# Patient Record
Sex: Male | Born: 1955 | Race: White | Hispanic: No | Marital: Single | Smoking: Former smoker
Health system: Southern US, Community
[De-identification: ages and names within clinical notes are randomized; demographics above are authoritative.]

## PROBLEM LIST (undated history)

## (undated) DIAGNOSIS — Z7289 Other problems related to lifestyle: Secondary | ICD-10-CM

## (undated) DIAGNOSIS — J189 Pneumonia, unspecified organism: Secondary | ICD-10-CM

## (undated) DIAGNOSIS — N3020 Other chronic cystitis without hematuria: Secondary | ICD-10-CM

## (undated) DIAGNOSIS — J449 Chronic obstructive pulmonary disease, unspecified: Secondary | ICD-10-CM

## (undated) DIAGNOSIS — R42 Dizziness and giddiness: Secondary | ICD-10-CM

## (undated) DIAGNOSIS — C3490 Malignant neoplasm of unspecified part of unspecified bronchus or lung: Secondary | ICD-10-CM

## (undated) DIAGNOSIS — Z789 Other specified health status: Secondary | ICD-10-CM

## (undated) DIAGNOSIS — B1920 Unspecified viral hepatitis C without hepatic coma: Secondary | ICD-10-CM

## (undated) DIAGNOSIS — N529 Male erectile dysfunction, unspecified: Secondary | ICD-10-CM

## (undated) DIAGNOSIS — F1090 Alcohol use, unspecified, uncomplicated: Secondary | ICD-10-CM

## (undated) DIAGNOSIS — K439 Ventral hernia without obstruction or gangrene: Secondary | ICD-10-CM

## (undated) DIAGNOSIS — Z87828 Personal history of other (healed) physical injury and trauma: Secondary | ICD-10-CM

## (undated) HISTORY — PX: OTHER SURGICAL HISTORY: SHX169

---

## 2012-11-30 DIAGNOSIS — B1920 Unspecified viral hepatitis C without hepatic coma: Secondary | ICD-10-CM | POA: Insufficient documentation

## 2012-11-30 DIAGNOSIS — C3490 Malignant neoplasm of unspecified part of unspecified bronchus or lung: Secondary | ICD-10-CM | POA: Insufficient documentation

## 2018-01-04 ENCOUNTER — Encounter: Payer: Self-pay | Admitting: Radiation Oncology

## 2018-01-04 NOTE — Progress Notes (Signed)
Thoracic Location of Tumor / Histology: History of lung cancer x3 with enlarging RLL lesion  CT Thorax w/o contrast 10/05/17:  Slowly enlarging ground glass nodule in the anterior aspect of the right lower lobe is suspicious for a lesion in the adenocarcinoma in situ spectrum.   Diagnosis/Treatment:  08/18/2009 Stage IB (pT2aN0M0) adenocarcinoma LUL  11/16/2009 Stage IA (pT1aN0M0) adenocarcinoma RUL  November 2014 enlarging RLL nodule noted on surveillance CT scan presumed cancerous  Treatment:  S/p LUL lobectomy 2011  S/p RUL wedge resection 2011  SBRT 50 Gy in 10 fractions to RLL 01/01/13-01/12/13   Tobacco/Marijuana/Snuff/ETOH use: current cigarette smoker; 1/2 ppd  Reports not having had anything alcoholic to drink in a week.   Signs/Symptoms  Weight changes, if any: Denies  Respiratory complaints, if any: Denies  Hemoptysis, if any: Denies  Pain issues, if any:  Denies  SAFETY ISSUES:  Prior radiation? SBRT 50 Gy in 10 fractions to RLL 01/01/13-01/12/13  Pacemaker/ICD? No  Possible current pregnancy? N/A; pt is male  Is the patient on methotrexate? No  Current Complaints / other details:  Xavier Jackson presents today with his sister for his radiation consult. Denies any hemoptysis or purulent sputum. Reports shortness of breath with mild to moderate activity. Denies any nausea, vomiting, or diarrhea. Reports mild fatigue but states that it is manageable. Received radiation in 2014 at [facility] from Dr. Brunetta Jeans.

## 2018-01-06 ENCOUNTER — Other Ambulatory Visit: Payer: Self-pay | Admitting: Radiation Oncology

## 2018-01-06 ENCOUNTER — Ambulatory Visit
Admission: RE | Admit: 2018-01-06 | Discharge: 2018-01-06 | Disposition: A | Discharge status: Home / Self Care | Payer: Self-pay | Source: Ambulatory Visit | Attending: Radiation Oncology | Admitting: Radiation Oncology

## 2018-01-06 DIAGNOSIS — C3490 Malignant neoplasm of unspecified part of unspecified bronchus or lung: Secondary | ICD-10-CM

## 2018-01-09 ENCOUNTER — Ambulatory Visit
Admission: RE | Admit: 2018-01-09 | Discharge: 2018-01-09 | Disposition: A | Discharge status: Home / Self Care | Payer: Medicare Other | Source: Ambulatory Visit | Attending: Radiation Oncology | Admitting: Radiation Oncology

## 2018-01-09 ENCOUNTER — Other Ambulatory Visit: Payer: Self-pay

## 2018-01-09 ENCOUNTER — Encounter: Payer: Self-pay | Admitting: Radiation Oncology

## 2018-01-09 VITALS — BP 143/86 | HR 76 | Temp 99.1°F | Resp 18 | Ht 73.0 in | Wt 204.1 lb

## 2018-01-09 DIAGNOSIS — C3431 Malignant neoplasm of lower lobe, right bronchus or lung: Secondary | ICD-10-CM | POA: Insufficient documentation

## 2018-01-09 HISTORY — DX: Unspecified viral hepatitis C without hepatic coma: B19.20

## 2018-01-09 HISTORY — DX: Other chronic cystitis without hematuria: N30.20

## 2018-01-09 HISTORY — DX: Male erectile dysfunction, unspecified: N52.9

## 2018-01-09 HISTORY — DX: Chronic obstructive pulmonary disease, unspecified: J44.9

## 2018-01-09 HISTORY — DX: Pneumonia, unspecified organism: J18.9

## 2018-01-09 HISTORY — DX: Alcohol use, unspecified, uncomplicated: F10.90

## 2018-01-09 HISTORY — DX: Dizziness and giddiness: R42

## 2018-01-09 HISTORY — DX: Other problems related to lifestyle: Z72.89

## 2018-01-09 HISTORY — DX: Other specified health status: Z78.9

## 2018-01-09 HISTORY — DX: Personal history of other (healed) physical injury and trauma: Z87.828

## 2018-01-09 HISTORY — DX: Ventral hernia without obstruction or gangrene: K43.9
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
## 2018-01-30 ENCOUNTER — Ambulatory Visit: Payer: No Typology Code available for payment source | Admitting: Radiation Oncology

## 2018-02-06 ENCOUNTER — Ambulatory Visit
Admission: RE | Admit: 2018-02-06 | Discharge: 2018-02-06 | Disposition: A | Discharge status: Home / Self Care | Payer: No Typology Code available for payment source | Source: Ambulatory Visit | Attending: Radiation Oncology | Admitting: Radiation Oncology

## 2018-02-06 DIAGNOSIS — Z510 Encounter for antineoplastic radiation therapy: Secondary | ICD-10-CM | POA: Diagnosis present

## 2018-02-06 DIAGNOSIS — C3431 Malignant neoplasm of lower lobe, right bronchus or lung: Secondary | ICD-10-CM | POA: Diagnosis not present

## 2018-02-06 NOTE — Progress Notes (Signed)
  Radiation Oncology         [phone]) [phone] ________________________________  Name: Xavier Jackson MRN: [id number]  Date: 02/06/2018  DOB: 1955/05/14   STEREOTACTIC BODY RADIOTHERAPY SIMULATION AND TREATMENT PLANNING NOTE    DIAGNOSIS:  The encounter diagnosis was Malignant neoplasm of lower lobe of right lung, clinical stage IA ([place]).   NARRATIVE:  The patient was brought to the [facility].  Identity was confirmed.  All relevant records and images related to the planned course of therapy were reviewed.  The patient freely provided informed written consent to proceed with treatment after reviewing the details related to the planned course of therapy. The consent form was witnessed and verified by the simulation staff.  Then, the patient was set-up in a stable reproducible  supine position for radiation therapy.  A BodyFix immobilization pillow was fabricated for reproducible positioning.  Then I personally applied the abdominal compression paddle to limit respiratory excursion.  4D respiratoy motion management CT images were obtained.  Surface markings were placed.  The CT images were loaded into the planning software.  Then, using Cine, MIP, and standard views, the internal target volume (ITV) and planning target volumes (PTV) were delinieated, and avoidance structures were contoured.  Treatment planning then occurred.  The radiation prescription was entered and confirmed.  A total of two complex treatment devices were fabricated in the form of the BodyFix immobilization pillow and a neck accuform cushion.  I have requested : 3D Simulation  I have requested a DVH of the following structures: Heart, Lungs, Esophagus, Chest Wall, Brachial Plexus, Major Blood Vessels, and targets.  PLAN:  The patient will receive 54 Gy in 3 fractions.   On the patient's PET scan 11/04/2017 and this lesion measured 0.7 cm., Size was below the PET resolution level. The patient's CT scan [facility] on December 3 this lesion had increased in size to 1.1 cm.  On the patient's  planning CT scan today this lesion measures 1.3 cm in greatest dimension. I discussed options for management with the patient including the continued close follow-up or to proceed with stereotactic body radiation therapy of this lesion. Patient feels most comfortable with proceeding with treatments at this time especially since this lesion has increased significantly in size since October of last year.  -----------------------------------  Blair Promise, PhD, MD This document serves as a record of services personally performed by Gery Pray, MD. It was created on his behalf by Mary-Margaret Loma Messing, a trained medical scribe. The creation of this record is based on the scribe's personal observations and the provider's statements to them. This document has been checked and approved by the attending provider.

## 2018-02-07 ENCOUNTER — Ambulatory Visit: Payer: No Typology Code available for payment source | Admitting: Radiation Oncology

## 2018-02-08 DIAGNOSIS — Z510 Encounter for antineoplastic radiation therapy: Secondary | ICD-10-CM | POA: Diagnosis not present

## 2018-02-09 ENCOUNTER — Ambulatory Visit: Payer: No Typology Code available for payment source | Admitting: Radiation Oncology

## 2018-02-14 ENCOUNTER — Ambulatory Visit: Payer: No Typology Code available for payment source | Admitting: Radiation Oncology

## 2018-02-15 ENCOUNTER — Ambulatory Visit
Admission: RE | Admit: 2018-02-15 | Discharge: 2018-02-15 | Disposition: A | Discharge status: Home / Self Care | Payer: No Typology Code available for payment source | Source: Ambulatory Visit | Attending: Radiation Oncology | Admitting: Radiation Oncology

## 2018-02-15 DIAGNOSIS — Z510 Encounter for antineoplastic radiation therapy: Secondary | ICD-10-CM | POA: Diagnosis not present

## 2018-02-15 DIAGNOSIS — C3431 Malignant neoplasm of lower lobe, right bronchus or lung: Secondary | ICD-10-CM

## 2018-02-15 NOTE — Progress Notes (Signed)
  Radiation Oncology         [phone]) [phone] ________________________________  Name: Allan Minotti MRN: [id number]  Date: 02/15/2018  DOB: 28-Oct-1955  Stereotactic Body Radiotherapy Treatment Procedure Note  NARRATIVE:  Jamarii Banks was brought to the stereotactic radiation treatment machine and placed supine on the CT couch. The patient was set up for stereotactic body radiotherapy on the body fix pillow.  3D TREATMENT PLANNING AND DOSIMETRY:  The patient's radiation plan was reviewed and approved prior to starting treatment.  It showed 3-dimensional radiation distributions overlaid onto the planning CT.  The [facility] for the target structures as well as the organs at risk were reviewed. The documentation of this is filed in the radiation oncology EMR.  SIMULATION VERIFICATION:  The patient underwent CT imaging on the treatment unit.  These were carefully aligned to document that the ablative radiation dose would cover the target volume and maximally spare the nearby organs at risk according to the planned distribution.  SPECIAL TREATMENT PROCEDURE: Jakarri Lesko received high dose ablative stereotactic body radiotherapy to the planned target volume without unforeseen complications. Treatment was delivered uneventfully. The high doses associated with stereotactic body radiotherapy and the significant potential risks require careful treatment set up and patient monitoring constituting a special treatment procedure   STEREOTACTIC TREATMENT MANAGEMENT:  Following delivery, the patient was evaluated clinically. The patient tolerated treatment without significant acute effects, and was discharged to home in stable condition.    PLAN: Continue treatment as planned.  ________________________________  Blair Promise, PhD, MD   This document serves as a record of services personally performed by Gery Pray, MD. It was created on his behalf by Mary-Margaret Loma Messing, a trained medical  scribe. The creation of this record is based on the scribe's personal observations and the provider's statements to them. This document has been checked and approved by the attending provider.

## 2018-02-16 ENCOUNTER — Ambulatory Visit: Payer: No Typology Code available for payment source | Admitting: Radiation Oncology

## 2018-02-17 ENCOUNTER — Ambulatory Visit
Admission: RE | Admit: 2018-02-17 | Discharge: 2018-02-17 | Disposition: A | Discharge status: Home / Self Care | Payer: No Typology Code available for payment source | Source: Ambulatory Visit | Attending: Radiation Oncology | Admitting: Radiation Oncology

## 2018-02-17 DIAGNOSIS — Z510 Encounter for antineoplastic radiation therapy: Secondary | ICD-10-CM | POA: Diagnosis not present

## 2018-02-21 ENCOUNTER — Ambulatory Visit
Admission: RE | Admit: 2018-02-21 | Discharge: 2018-02-21 | Disposition: A | Discharge status: Home / Self Care | Payer: No Typology Code available for payment source | Source: Ambulatory Visit | Attending: Radiation Oncology | Admitting: Radiation Oncology

## 2018-02-21 ENCOUNTER — Encounter: Payer: Self-pay | Admitting: Radiation Oncology

## 2018-02-21 DIAGNOSIS — Z510 Encounter for antineoplastic radiation therapy: Secondary | ICD-10-CM | POA: Diagnosis not present

## 2018-02-21 DIAGNOSIS — C3431 Malignant neoplasm of lower lobe, right bronchus or lung: Secondary | ICD-10-CM

## 2018-02-21 NOTE — Progress Notes (Signed)
  Radiation Oncology         (336) [phone] ________________________________  Name: Xavier Jackson MRN: [id number]  Date: 02/21/2018  DOB: 28-Jun-1955  Stereotactic Body Radiotherapy Treatment Procedure Note  NARRATIVE:  Xavier Jackson was brought to the stereotactic radiation treatment machine and placed supine on the CT couch. The patient was set up for stereotactic body radiotherapy on the body fix pillow.  3D TREATMENT PLANNING AND DOSIMETRY:  The patient's radiation plan was reviewed and approved prior to starting treatment.  It showed 3-dimensional radiation distributions overlaid onto the planning CT.  The [facility] for the target structures as well as the organs at risk were reviewed. The documentation of this is filed in the radiation oncology EMR.  SIMULATION VERIFICATION:  The patient underwent CT imaging on the treatment unit.  These were carefully aligned to document that the ablative radiation dose would cover the target volume and maximally spare the nearby organs at risk according to the planned distribution.  SPECIAL TREATMENT PROCEDURE: Xavier Jackson received high dose ablative stereotactic body radiotherapy to the planned target volume without unforeseen complications. Treatment was delivered uneventfully. The high doses associated with stereotactic body radiotherapy and the significant potential risks require careful treatment set up and patient monitoring constituting a special treatment procedure   STEREOTACTIC TREATMENT MANAGEMENT:  Following delivery, the patient was evaluated clinically. The patient tolerated treatment without significant acute effects, and was discharged to home in stable condition.    PLAN: Continue treatment as planned.  ________________________________  Blair Promise, PhD, MD

## 2018-02-23 NOTE — Progress Notes (Signed)
  Radiation Oncology         [phone]) [phone] ________________________________  Name: Xavier Jackson MRN: [id number]  Date: 02/21/2018  DOB: 1955/04/04  End of Treatment Note  Diagnosis:   63 y.o. male with Malignant neoplasm of lower lobe of right lung, clinical stage IA ([place])     Indication for treatment:  Curative       Radiation treatment dates:   02/15/2018, 02/17/2018, 02/21/2018  Site/dose:   The tumor in the right lower lobe was treated with a course of stereotactic body radiation treatment. The patient received 54 Gy in 3 fractions at 18 Gy per fraction.  Beams/energy:   SBRT/SRT-VMAT // 6X-FFF Photon   Narrative: The patient tolerated radiation treatment relatively well.   The patient did not have any signs of acute toxicity during treatment.    Plan: The patient has completed radiation treatment. The patient will return to radiation oncology clinic for routine followup in one month. I recommended the patient consider using his albuterol treatment in light of the wheezing in his left lung, and he will meet with his [place] oncologist in late February. I advised the patient to call or return sooner if they have any questions or concerns related to their recovery or treatment.   ------------------------------------------------  Blair Promise, PhD, MD  This document serves as a record of services personally performed by Gery Pray, MD. It was created on his behalf by Rae Lips, a trained medical scribe. The creation of this record is based on the scribe's personal observations and the provider's statements to them. This document has been checked and approved by the attending provider.

## 2018-03-23 ENCOUNTER — Telehealth: Payer: Self-pay | Admitting: *Deleted

## 2018-03-23 NOTE — Telephone Encounter (Signed)
[facility], [facility] consult note , end of tx note, sim and planning note

## 2018-03-30 ENCOUNTER — Ambulatory Visit
Admission: RE | Admit: 2018-03-30 | Discharge: 2018-03-30 | Disposition: A | Discharge status: Home / Self Care | Payer: Medicare Other | Source: Ambulatory Visit | Attending: Radiation Oncology | Admitting: Radiation Oncology

## 2018-03-30 ENCOUNTER — Encounter: Payer: Self-pay | Admitting: Radiation Oncology

## 2018-03-30 ENCOUNTER — Other Ambulatory Visit: Payer: Self-pay

## 2018-03-30 ENCOUNTER — Encounter (INDEPENDENT_AMBULATORY_CARE_PROVIDER_SITE_OTHER): Payer: Self-pay

## 2018-03-30 VITALS — BP 111/76 | HR 82 | Temp 99.1°F | Resp 18 | Wt 210.2 lb

## 2018-03-30 DIAGNOSIS — R0602 Shortness of breath: Secondary | ICD-10-CM | POA: Diagnosis not present

## 2018-03-30 DIAGNOSIS — R05 Cough: Secondary | ICD-10-CM | POA: Diagnosis not present

## 2018-03-30 DIAGNOSIS — Z923 Personal history of irradiation: Secondary | ICD-10-CM | POA: Insufficient documentation

## 2018-03-30 DIAGNOSIS — C3431 Malignant neoplasm of lower lobe, right bronchus or lung: Secondary | ICD-10-CM | POA: Diagnosis present

## 2018-03-30 DIAGNOSIS — Z79899 Other long term (current) drug therapy: Secondary | ICD-10-CM | POA: Diagnosis not present

## 2018-03-30 NOTE — Progress Notes (Signed)
  Radiation Oncology         [phone]) [phone] ________________________________  Name: Xavier Jackson MRN: [id number]  Date: 03/30/2018  DOB: 13-May-1955  Follow-Up Visit Note  CC: System, Pcp Not In  Alcide Evener, MD    ICD-10-CM   1. Malignant neoplasm of lower lobe of right lung [facility]) C34.31     Diagnosis:   Clinical Stage IA Right Lower Lobe Lung Cancer  Interval Since Last Radiation:  5 weeks and 2 days  1/22, 1/24, 02/21/2018: Right Lung, Lower Lobe / 54 Gy in 3 fractions (SBRT)  Narrative:  The patient returns today for routine follow-up after completion of radiotherapy on 02/21/2018. He reports [facility] and will see Dr. Vashti Hey following that scan.  He reports doing well overall. He denies any pain, radiation-related fatigue, hemoptysis, and difficulty swallowing. He reports cough with white sputum, heart burn with some relief by OTC antacids, and occasional shortness of breath with exertion.  ALLERGIES:  has No Known Allergies.  Meds: Current Outpatient Medications  Medication Sig Dispense Refill  . albuterol (PROVENTIL HFA;VENTOLIN HFA) 108 (90 Base) MCG/ACT inhaler Inhale 2 puffs into the lungs 4 (four) times daily.    Marland Kitchen alprostadil (EDEX) 40 MCG injection 40 mcg by Intracavitary route as needed for erectile dysfunction. use no more than 3 times per week    . Meclizine HCl 25 MG CHEW Chew 1 tablet by mouth 3 (three) times daily.     No current facility-administered medications for this encounter.     Physical Findings: The patient is in no acute distress. Patient is alert and oriented.  weight is 210 lb 3.2 oz (95.3 kg). His oral temperature is 99.1 F (37.3 C). His blood pressure is 111/76 and his pulse is 82. His respiration is 18 and oxygen saturation is 96%. .  No significant changes.  Lab Findings: No results found for: WBC, HGB, HCT, MCV, PLT  Radiographic Findings: No results found.  Impression:   Clinical Stage IA Right Lower Lobe Lung Cancer No reported side effects from stereotactic body radiation therapy  Plan:  Patient reports he will [facility]. He will follow up in radiation oncology in 3 months.  -----------------------------------  Blair Promise, PhD, MD  This document serves as a record of services personally performed by Gery Pray, MD. It was created on his behalf by Wilburn Mylar, a trained medical scribe. The creation of this record is based on the scribe's personal observations and the provider's statements to them. This document has been checked and approved by the attending provider.

## 2018-03-30 NOTE — Progress Notes (Signed)
Pt presents today for f/u with Dr. Sondra Come. Pt is unaccompanied. Pt denies any radiation-related fatigue. Pt denies c/o pain. Pt reports cough with white sputum. Pt denies hemoptysis. Pt reports "heart burn" sensation and is eased some by OTC antiacids. Pt denies difficulty swallowing. Pt reports SOB with exertion occasionally.   BP 111/76 (BP Location: Left Arm, Patient Position: Sitting)   Pulse 82   Temp 99.1 F (37.3 C) (Oral)   Resp 18   Wt 210 lb 3.2 oz (95.3 kg)   SpO2 96%   BMI 27.73 kg/m   Wt Readings from Last 3 Encounters:  03/30/18 210 lb 3.2 oz (95.3 kg)  01/09/18 204 lb 1 oz (92.6 kg)   Loma Sousa, RN BSN

## 2018-07-06 ENCOUNTER — Ambulatory Visit
Admission: RE | Admit: 2018-07-06 | Discharge: 2018-07-06 | Disposition: A | Discharge status: Home / Self Care | Payer: No Typology Code available for payment source | Source: Ambulatory Visit | Attending: Radiation Oncology | Admitting: Radiation Oncology

## 2019-01-26 DIAGNOSIS — S72002A Fracture of unspecified part of neck of left femur, initial encounter for closed fracture: Secondary | ICD-10-CM | POA: Insufficient documentation

## 2019-01-26 HISTORY — PX: HIP ARTHROPLASTY: SHX981

## 2019-01-29 DIAGNOSIS — D509 Iron deficiency anemia, unspecified: Secondary | ICD-10-CM | POA: Insufficient documentation

## 2019-01-30 DIAGNOSIS — J449 Chronic obstructive pulmonary disease, unspecified: Secondary | ICD-10-CM | POA: Insufficient documentation

## 2019-01-30 DIAGNOSIS — G8918 Other acute postprocedural pain: Secondary | ICD-10-CM | POA: Insufficient documentation

## 2019-01-30 DIAGNOSIS — Z299 Encounter for prophylactic measures, unspecified: Secondary | ICD-10-CM | POA: Insufficient documentation

## 2019-02-07 DIAGNOSIS — Z789 Other specified health status: Secondary | ICD-10-CM | POA: Insufficient documentation

## 2019-02-07 DIAGNOSIS — Z7409 Other reduced mobility: Secondary | ICD-10-CM | POA: Insufficient documentation

## 2019-02-09 DIAGNOSIS — M25519 Pain in unspecified shoulder: Secondary | ICD-10-CM | POA: Insufficient documentation

## 2019-02-09 DIAGNOSIS — K5730 Diverticulosis of large intestine without perforation or abscess without bleeding: Secondary | ICD-10-CM | POA: Insufficient documentation

## 2019-02-09 DIAGNOSIS — L989 Disorder of the skin and subcutaneous tissue, unspecified: Secondary | ICD-10-CM | POA: Insufficient documentation

## 2019-02-09 DIAGNOSIS — C61 Malignant neoplasm of prostate: Secondary | ICD-10-CM | POA: Insufficient documentation

## 2019-02-09 DIAGNOSIS — R7611 Nonspecific reaction to tuberculin skin test without active tuberculosis: Secondary | ICD-10-CM | POA: Insufficient documentation

## 2019-02-09 DIAGNOSIS — M129 Arthropathy, unspecified: Secondary | ICD-10-CM | POA: Insufficient documentation

## 2019-02-09 DIAGNOSIS — Z800 Family history of malignant neoplasm of digestive organs: Secondary | ICD-10-CM | POA: Insufficient documentation

## 2019-02-09 DIAGNOSIS — D126 Benign neoplasm of colon, unspecified: Secondary | ICD-10-CM | POA: Insufficient documentation

## 2019-02-09 DIAGNOSIS — R945 Abnormal results of liver function studies: Secondary | ICD-10-CM | POA: Insufficient documentation

## 2019-02-09 DIAGNOSIS — L570 Actinic keratosis: Secondary | ICD-10-CM | POA: Insufficient documentation

## 2019-02-09 DIAGNOSIS — F1020 Alcohol dependence, uncomplicated: Secondary | ICD-10-CM | POA: Insufficient documentation

## 2019-02-09 DIAGNOSIS — N320 Bladder-neck obstruction: Secondary | ICD-10-CM | POA: Insufficient documentation

## 2019-02-09 DIAGNOSIS — L719 Rosacea, unspecified: Secondary | ICD-10-CM | POA: Insufficient documentation

## 2019-02-09 DIAGNOSIS — R042 Hemoptysis: Secondary | ICD-10-CM | POA: Insufficient documentation

## 2019-02-09 DIAGNOSIS — F1010 Alcohol abuse, uncomplicated: Secondary | ICD-10-CM | POA: Insufficient documentation

## 2019-02-09 DIAGNOSIS — M2040 Other hammer toe(s) (acquired), unspecified foot: Secondary | ICD-10-CM | POA: Insufficient documentation

## 2019-02-09 DIAGNOSIS — J984 Other disorders of lung: Secondary | ICD-10-CM | POA: Insufficient documentation

## 2019-02-09 DIAGNOSIS — N62 Hypertrophy of breast: Secondary | ICD-10-CM | POA: Insufficient documentation

## 2019-02-09 DIAGNOSIS — J40 Bronchitis, not specified as acute or chronic: Secondary | ICD-10-CM | POA: Insufficient documentation

## 2019-02-09 DIAGNOSIS — F5221 Male erectile disorder: Secondary | ICD-10-CM | POA: Insufficient documentation

## 2019-02-09 DIAGNOSIS — Z85118 Personal history of other malignant neoplasm of bronchus and lung: Secondary | ICD-10-CM | POA: Insufficient documentation

## 2019-02-09 DIAGNOSIS — Z87891 Personal history of nicotine dependence: Secondary | ICD-10-CM | POA: Insufficient documentation

## 2019-02-09 DIAGNOSIS — Z8042 Family history of malignant neoplasm of prostate: Secondary | ICD-10-CM | POA: Insufficient documentation

## 2019-02-09 DIAGNOSIS — R972 Elevated prostate specific antigen [PSA]: Secondary | ICD-10-CM | POA: Insufficient documentation

## 2019-02-09 DIAGNOSIS — R109 Unspecified abdominal pain: Secondary | ICD-10-CM | POA: Insufficient documentation

## 2019-05-31 ENCOUNTER — Other Ambulatory Visit: Payer: Self-pay | Admitting: Radiation Oncology

## 2019-05-31 ENCOUNTER — Ambulatory Visit
Admission: RE | Admit: 2019-05-31 | Discharge: 2019-05-31 | Disposition: A | Discharge status: Home / Self Care | Payer: Self-pay | Source: Ambulatory Visit | Attending: Radiation Oncology | Admitting: Radiation Oncology

## 2019-05-31 DIAGNOSIS — C3490 Malignant neoplasm of unspecified part of unspecified bronchus or lung: Secondary | ICD-10-CM

## 2019-06-06 ENCOUNTER — Other Ambulatory Visit: Payer: Self-pay

## 2019-06-06 ENCOUNTER — Encounter: Payer: Self-pay | Admitting: Radiation Oncology

## 2019-06-06 ENCOUNTER — Ambulatory Visit
Admission: RE | Admit: 2019-06-06 | Discharge: 2019-06-06 | Disposition: A | Discharge status: Home / Self Care | Payer: No Typology Code available for payment source | Source: Ambulatory Visit | Attending: Radiation Oncology | Admitting: Radiation Oncology

## 2019-06-06 VITALS — BP 147/86 | HR 90 | Temp 98.3°F | Resp 18 | Ht 72.0 in | Wt 216.1 lb

## 2019-06-06 DIAGNOSIS — C3431 Malignant neoplasm of lower lobe, right bronchus or lung: Secondary | ICD-10-CM | POA: Diagnosis not present

## 2019-06-06 DIAGNOSIS — Z923 Personal history of irradiation: Secondary | ICD-10-CM | POA: Diagnosis not present

## 2019-06-06 DIAGNOSIS — C3490 Malignant neoplasm of unspecified part of unspecified bronchus or lung: Secondary | ICD-10-CM

## 2019-06-06 NOTE — Progress Notes (Signed)
Thoracic Location of Tumor / Histology: Non small cell lung cancer- RLL recurrence  MRI 05/24/2019:   PET 05/07/2019: Enlarging right lower lobe nodule with a maximum SUV of 3.4.  New hypermetabolic lytic lesion in the right acromion max SUV 12.4.  Increased activity along the right lower lobe bandlike density max SUV 2.8 which is greater than previous.  Moderate hypermetabolic activity of calcifications along the anterior omentum.  Questionable small focus of accentuated activity along the left anterior inferior iliac spine SUV 3.7.   CT Chest 04/10/2019: Interval enlargement of a nodule near the site of right upper lobe wedge resection adjacent to the mediastinum, currently 9 x 6 mm, previously 7 x 6 mm.  Additionally, a right lower lobe nodule that previously measured 5 mm in December currently has enlarged, now 9 mm with irregular margins, highly suspicious for malignancy.  CT Chest 01/01/2019: Postsurgical changes of prior left upper lobectomy and right upper lobe wedge resection.  Previously described nodular opacity related to the right upper lobe wedge resection measures 7 x 6 mm.  It has slowly grown compared with prior examinations.  Tumor is not excluded.  5 mm nodule within the superior right lower lobe.  This has slowly grown over multiple prior examinations.  Malignancy is not excluded.  Patient presented  Biopsies of   Tobacco/Marijuana/Snuff/ETOH use: Current Smoker 1/2 PPD  Past/Anticipated interventions by cardiothoracic surgery, if any:   Past/Anticipated interventions by medical oncology, if any:  [facility] 05/10/2019 - We discussed with Xavier Jackson his PET scan and explained that this is all concerning from metastatic cancer and would recommend he undergo a biopsy of the nodule near his mediastinum for a definitive diagnosis which he is agreeable to. -He will also undergo an MRI of his brain to complete his work up. -He will return to clinic in 3 weeks to review results.  -  Given the nodule size without a definitive airway leading to it, navigational bronchoscopy will probably not be high yield.  He would also need a new scan as the most recent one is over a month old.  Additionally, due to the nodules rapid growth I would recommend referring for empiric SBRT again. -There are no enlarged or otherwise abnormal lymph nodes from the available imaging and so EBUS is probably not going to be helpful.  History -08/18/2009- Stage IB adenocarcinoma LUL~ LUL lobectomy -10/27/2009- Stage IA adenocarcinoma RUL~ Wedge resection -11/2012- Enlarging RLL nodule noted on surveillance CT scan, presumed cancerous. -Clinical diagnosis Stage I NSCLC with enlarging RLL nodule with mild hypermetabolic activity on PET 99/3570.  -SBRT 50 Gy x 10 fractions to RLL 12/8-12/19/2014. -1/22-1/28/2020 SBRT RLL 54 Gy in 3 fractions   Signs/Symptoms  Weight changes, if any: No  Respiratory complaints, if any: COPD sx  Hemoptysis, if any: No  Pain issues, if any: left hip "4"  SAFETY ISSUES:  Prior radiation? SBRT RLL x 2  Pacemaker/ICD? No   Possible current pregnancy? n/a Is the patient on methotrexate? No Current Complaints / other details:    Wt Readings from Last 3 Encounters:  06/06/19 216 lb 2 oz (98 kg)  03/30/18 210 lb 3.2 oz (95.3 kg)  01/09/18 204 lb 1 oz (92.6 kg)      Vitals:   06/06/19 1510  BP: (!) 147/86  Pulse: 90  Temp: 98.3 F (36.8 C)  Resp: 18  Height: 6' (1.829 m)  Weight: 216 lb 2 oz (98 kg)  SpO2: 99%  TempSrc: Temporal  BMI (Calculated): 29.31

## 2019-06-06 NOTE — Progress Notes (Signed)
Radiation Oncology         [phone] ________________________________  Name: Xavier Jackson MRN: [id number]  Date: 06/06/2019  DOB: [birth year]  Re-Consultation Note  CC: System, [place] Not In  Dierdre Searles, P*    ICD-10-CM   1. Malignant neoplasm of bronchus and lung [facility])  C34.90 Ambulatory referral to Social Work  2. Malignant neoplasm of lower lobe of right lung (HCC)  C34.31     Diagnosis: Clinical stage IA right lower lobe lung cancer wit enlargement of right lower lobe nodule (oligometastasis)  Narrative:  The patient returns today to discuss radiation treatment options. He was initially seen in consultation on 01/09/2018 and was then treated with SBRT/SRT-VMAT // 6X-FFF Photon (54 Gy in 3 fractions at 18 Gy per fraction) directed at the tumor in the right lower lobe. Treatment dates were 02/15/2018, 02/17/2018, and 02/21/2018. He tolerated radiation treatment relatively well without any signs of acute toxicity during treatment. He was seen in follow-up on 03/30/2018, during which time he was doing well overall without any reported side effects from SBRT. He was to undergo repeat chest CT scan with the [facility] on 05/18/2018 and then follow-up with radiation oncology in three months. However, he did not return for follow-up.   In the interim, the patient has undergone three CT scans of his chest. Results are as follows: CT of chest on 06/19/2018 showed postsurgical changes with interval change in a ground-glass nodule in the anterior right lower lobe into a solid irregular nodule measuring 10 x 5 mm. The change was worrisome for adenocarcinoma. There were additional areas of nodularity and increased density that were unchanged without any new nodules. Finally, there was an interval increase in the 11 mm lymph node in the gastrohepatic ligament. CT of chest on 01/01/2019 showed postsurgical changes of prior left upper lobectomy and right upper lobe wedge resection. The previously  described nodular opacity related to the right upper lobe wedge resection measure 7 x 6 mm and had slowly grown compared to prior examinations; tumor was not excluded. There was also a 5 mm nodule within the superior right lower lobe that had slowly grown over multiple prior examinations; malignancy was not excluded. Finally, there were evolving postradiation changes at treated right anterior lower lobe nodule. CT of chest on 04/10/2019 showed interval enlargement of a nodule near the site of right upper lobe resection adjacent to the mediastinum that was then 9 x 6 mm as compared to 7 x 6 mm previously. Additionally, a right lower lobe nodule that previously measured 5 mm was enlarged and measured 9 mm with irregular margins, highly suspicious for malignancy. There was also a soft tissue density adjacent to the right inferior pulmonary vein that was essentially unchanged.   PET scan on 05/07/2019 showed an enlarging right lower lobe nodule with a maximum SUV of 3.4. There was a new hypermetabolic lytic lesion in the right acromion with a maximum SUV of 12.4. There was increased activity along the right lower lobe bandlike density with a maximum SUV of 2.8, which is greater than previous. Finally, there was moderate hypermetabolic activity of calcifications along the anterior omentum along with questionable small focus of accentuated activity along the left anterior inferior iliac spine.  MRI of head on 05/31/2019 was performed at the [facility] morning according to the patient.  However we do not have the results of this brain MRI  On review of systems, the patient reports left hip pain and shortness of breath  associated with COPD. He denies hemoptysis, weight changes, and any other symptoms.  Patient reports falling and breaking his left hip is chronic pain in this area.  Incident happened earlier this year   Allergies:  has No Known Allergies.  Meds: Current Outpatient Medications  Medication Sig  Dispense Refill  . albuterol (PROVENTIL HFA;VENTOLIN HFA) 108 (90 Base) MCG/ACT inhaler Inhale 2 puffs into the lungs 4 (four) times daily.    . Meclizine HCl 25 MG CHEW Chew 1 tablet by mouth 3 (three) times daily.    Marland Kitchen alprostadil (EDEX) 40 MCG injection 40 mcg by Intracavitary route as needed for erectile dysfunction. use no more than 3 times per week    . Multiple Vitamin (QUINTABS) TABS Take by mouth.    . thiamine 100 MG tablet Take by mouth.     No current facility-administered medications for this encounter.    Physical Findings: The patient is in no acute distress. Patient is alert and oriented.  height is 6' (1.829 m) and weight is 216 lb 2 oz (98 kg). His temporal temperature is 98.3 F (36.8 C). His blood pressure is 147/86 (abnormal) and his pulse is 90. His respiration is 18 and oxygen saturation is 99%.  No significant changes. Lungs are clear to auscultation bilaterally. Heart has regular rate and rhythm. No palpable cervical, supraclavicular, or axillary adenopathy. Abdomen soft, non-tender, normal bowel sounds.   Lab Findings: No results found for: WBC, HGB, HCT, MCV, PLT  Radiographic Findings: I carefully reviewed the patient's most recent PET scan.  I  reviewed the images with the patient and his sister highlighting the areas of concern.  Impression: Clinical stage IA right lower lobe lung cancer now now with possible metastasis in the right shoulder region.  Patient is not symptomatic from this lesion.  He does have a PET positive enlarging lesion in the right lung and I would recommend SBRT to this area.  If his brain MRI shows metastatic disease we will need to consider radiation treatment to this area.  I discussed the course of stereotactic body radiation therapy with patient, associated side effects and toxicities.  Patient is well aware of this treatment in light of his previous SBRT.   Plan:  Patient is scheduled for CT simulation in the near future.  Anticipate  3 treatments directed at the PET positive nodule in the right lung.   -----------------------------------  Blair Promise, PhD, MD  This document serves as a record of services personally performed by Gery Pray, MD. It was created on his behalf by Clerance Lav, a trained medical scribe. The creation of this record is based on the scribe's personal observations and the provider's statements to them. This document has been checked and approved by the attending provider.

## 2019-06-07 ENCOUNTER — Encounter: Payer: Self-pay | Admitting: General Practice
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
## 2019-06-13 ENCOUNTER — Other Ambulatory Visit: Payer: Self-pay

## 2019-06-13 ENCOUNTER — Ambulatory Visit
Admission: RE | Admit: 2019-06-13 | Discharge: 2019-06-13 | Disposition: A | Discharge status: Home / Self Care | Payer: No Typology Code available for payment source | Source: Ambulatory Visit | Attending: Radiation Oncology | Admitting: Radiation Oncology

## 2019-06-13 DIAGNOSIS — C3431 Malignant neoplasm of lower lobe, right bronchus or lung: Secondary | ICD-10-CM | POA: Diagnosis not present

## 2019-06-13 NOTE — Progress Notes (Signed)
  Radiation Oncology         [phone] ________________________________  Name: Xavier Jackson MRN: [id number]  Date: 06/13/2019  DOB: 09-08-1955   STEREOTACTIC BODY RADIOTHERAPY SIMULATION AND TREATMENT PLANNING NOTE    DIAGNOSIS: Clinical stage IA right lower lobe lung cancer with enlargement of right lower lobe nodule (oligometastasis)  NARRATIVE:  The patient was brought to the [facility].  Identity was confirmed.  All relevant records and images related to the planned course of therapy were reviewed.  The patient freely provided informed written consent to proceed with treatment after reviewing the details related to the planned course of therapy. The consent form was witnessed and verified by the simulation staff.  Then, the patient was set-up in a stable reproducible  supine position for radiation therapy.  A BodyFix immobilization pillow was fabricated for reproducible positioning.  Then I personally applied the abdominal compression paddle to limit respiratory excursion.  4D respiratoy motion management CT images were obtained.  Surface markings were placed.  The CT images were loaded into the planning software.  Then, using Cine, MIP, and standard views, the internal target volume (ITV) and planning target volumes (PTV) were delinieated, and avoidance structures were contoured.  Treatment planning then occurred.  The radiation prescription was entered and confirmed.  A total of two complex treatment devices were fabricated in the form of the BodyFix immobilization pillow and a neck accuform cushion.  I have requested : 3D Simulation  I have requested a DVH of the following structures: Heart, Lungs, Esophagus, Chest Wall, Brachial Plexus, Major Blood Vessels, and targets.  PLAN:  The patient will receive 54 Gy in 3 fractions.   -----------------------------------  Blair Promise, PhD, MD  This document serves as a record of services personally performed by Gery Pray, MD. It was created on his behalf by Clerance Lav, a trained medical scribe. The creation of this record is based on the scribe's personal observations and the provider's statements to them. This document has been checked and approved by the attending provider.

## 2019-06-18 DIAGNOSIS — C3431 Malignant neoplasm of lower lobe, right bronchus or lung: Secondary | ICD-10-CM | POA: Diagnosis not present

## 2019-06-19 ENCOUNTER — Ambulatory Visit: Payer: No Typology Code available for payment source | Admitting: Radiation Oncology

## 2019-06-21 ENCOUNTER — Ambulatory Visit: Payer: No Typology Code available for payment source | Admitting: Radiation Oncology

## 2019-06-26 ENCOUNTER — Ambulatory Visit: Payer: No Typology Code available for payment source | Admitting: Radiation Oncology

## 2019-06-27 NOTE — Progress Notes (Signed)
°  Radiation Oncology         [phone] ________________________________  Name: Xavier Jackson MRN: [id number]  Date: 06/28/2019  DOB: [birth year]  Stereotactic Body Radiotherapy Treatment Procedure Note  NARRATIVE:  Ayomide Purdy was brought to the stereotactic radiation treatment machine and placed supine on the CT couch. The patient was set up for stereotactic body radiotherapy on the body fix pillow.  3D TREATMENT PLANNING AND DOSIMETRY:  The patient's radiation plan was reviewed and approved prior to starting treatment.  It showed 3-dimensional radiation distributions overlaid onto the planning CT.  The [facility] for the target structures as well as the organs at risk were reviewed. The documentation of this is filed in the radiation oncology EMR.  SIMULATION VERIFICATION:  The patient underwent CT imaging on the treatment unit.  These were carefully aligned to document that the ablative radiation dose would cover the target volume and maximally spare the nearby organs at risk according to the planned distribution.  SPECIAL TREATMENT PROCEDURE: Timmie Dugue received high dose ablative stereotactic body radiotherapy to the planned target volume without unforeseen complications. Treatment was delivered uneventfully. The high doses associated with stereotactic body radiotherapy and the significant potential risks require careful treatment set up and patient monitoring constituting a special treatment procedure   STEREOTACTIC TREATMENT MANAGEMENT:  Following delivery, the patient was evaluated clinically. The patient tolerated treatment without significant acute effects, and was discharged to home in stable condition.    PLAN: Continue treatment as planned.  ________________________________  Blair Promise, PhD, MD  This document serves as a record of services personally performed by Gery Pray, MD. It was created on his behalf by Clerance Lav, a trained medical scribe. The  creation of this record is based on the scribe's personal observations and the provider's statements to them. This document has been checked and approved by the attending provider.

## 2019-06-28 ENCOUNTER — Ambulatory Visit
Admission: RE | Admit: 2019-06-28 | Discharge: 2019-06-28 | Disposition: A | Discharge status: Home / Self Care | Payer: No Typology Code available for payment source | Source: Ambulatory Visit | Attending: Radiation Oncology | Admitting: Radiation Oncology

## 2019-06-28 ENCOUNTER — Other Ambulatory Visit: Payer: Self-pay

## 2019-06-28 DIAGNOSIS — C3431 Malignant neoplasm of lower lobe, right bronchus or lung: Secondary | ICD-10-CM | POA: Diagnosis present

## 2019-07-02 NOTE — Progress Notes (Signed)
°  Radiation Oncology         [phone] ________________________________  Name: Xavier Jackson MRN: [id number]  Date: 07/03/2019  DOB: 1955/04/26  Stereotactic Body Radiotherapy Treatment Procedure Note  NARRATIVE:  Alyas Creary was brought to the stereotactic radiation treatment machine and placed supine on the CT couch. The patient was set up for stereotactic body radiotherapy on the body fix pillow.  3D TREATMENT PLANNING AND DOSIMETRY:  The patient's radiation plan was reviewed and approved prior to starting treatment.  It showed 3-dimensional radiation distributions overlaid onto the planning CT.  The [facility] for the target structures as well as the organs at risk were reviewed. The documentation of this is filed in the radiation oncology EMR.  SIMULATION VERIFICATION:  The patient underwent CT imaging on the treatment unit.  These were carefully aligned to document that the ablative radiation dose would cover the target volume and maximally spare the nearby organs at risk according to the planned distribution.  SPECIAL TREATMENT PROCEDURE: Desmin Daleo received high dose ablative stereotactic body radiotherapy to the planned target volume without unforeseen complications. Treatment was delivered uneventfully. The high doses associated with stereotactic body radiotherapy and the significant potential risks require careful treatment set up and patient monitoring constituting a special treatment procedure   STEREOTACTIC TREATMENT MANAGEMENT:  Following delivery, the patient was evaluated clinically. The patient tolerated treatment without significant acute effects, and was discharged to home in stable condition.    PLAN: Continue treatment as planned.  ________________________________  Blair Promise, PhD, MD  This document serves as a record of services personally performed by Gery Pray, MD. It was created on his behalf by Clerance Lav, a trained medical scribe. The  creation of this record is based on the scribe's personal observations and the provider's statements to them. This document has been checked and approved by the attending provider.

## 2019-07-03 ENCOUNTER — Ambulatory Visit
Admission: RE | Admit: 2019-07-03 | Discharge: 2019-07-03 | Disposition: A | Discharge status: Home / Self Care | Payer: No Typology Code available for payment source | Source: Ambulatory Visit | Attending: Radiation Oncology | Admitting: Radiation Oncology

## 2019-07-03 ENCOUNTER — Other Ambulatory Visit: Payer: Self-pay

## 2019-07-03 DIAGNOSIS — C3431 Malignant neoplasm of lower lobe, right bronchus or lung: Secondary | ICD-10-CM | POA: Diagnosis not present

## 2019-07-04 NOTE — Progress Notes (Signed)
  Radiation Oncology         [phone] ________________________________  Name: Xavier Jackson MRN: [id number]  Date: 07/05/2019  DOB: 03-30-1955  Stereotactic Body Radiotherapy Treatment Procedure Note  NARRATIVE:  Xavier Jackson was brought to the stereotactic radiation treatment machine and placed supine on the CT couch. The patient was set up for stereotactic body radiotherapy on the body fix pillow.  3D TREATMENT PLANNING AND DOSIMETRY:  The patient's radiation plan was reviewed and approved prior to starting treatment.  It showed 3-dimensional radiation distributions overlaid onto the planning CT.  The Xavier Jackson for the target structures as well as the organs at risk were reviewed. The documentation of this is filed in the radiation oncology EMR.  SIMULATION VERIFICATION:  The patient underwent CT imaging on the treatment unit.  These were carefully aligned to document that the ablative radiation dose would cover the target volume and maximally spare the nearby organs at risk according to the planned distribution.  SPECIAL TREATMENT PROCEDURE: Xavier Jackson received high dose ablative stereotactic body radiotherapy to the planned target volume without unforeseen complications. Treatment was delivered uneventfully. The high doses associated with stereotactic body radiotherapy and the significant potential risks require careful treatment set up and patient monitoring constituting a special treatment procedure   STEREOTACTIC TREATMENT MANAGEMENT:  Following delivery, the patient was evaluated clinically. The patient tolerated treatment without significant acute effects, and was discharged to home in stable condition.    PLAN: Routine follow-up with radiation oncology in one month.  ________________________________  Xavier Promise, PhD, MD  This document serves as a record of services personally performed by Gery Pray, MD. It was created on his behalf by Clerance Lav, a  trained medical scribe. The creation of this record is based on the scribe's personal observations and the provider's statements to them. This document has been checked and approved by the attending provider.

## 2019-07-05 ENCOUNTER — Other Ambulatory Visit: Payer: Self-pay

## 2019-07-05 ENCOUNTER — Ambulatory Visit
Admission: RE | Admit: 2019-07-05 | Discharge: 2019-07-05 | Disposition: A | Discharge status: Home / Self Care | Payer: No Typology Code available for payment source | Source: Ambulatory Visit | Attending: Radiation Oncology | Admitting: Radiation Oncology

## 2019-07-05 ENCOUNTER — Encounter: Payer: Self-pay | Admitting: Radiation Oncology

## 2019-07-05 DIAGNOSIS — C3431 Malignant neoplasm of lower lobe, right bronchus or lung: Secondary | ICD-10-CM | POA: Diagnosis not present

## 2019-07-31 NOTE — Progress Notes (Incomplete)
  Patient Name: Xavier Jackson MRN: [id number] DOB: 08/31/1955 Referring Physician: Genia Del Date of Service: 07/05/2019 [facility], [place]                                                        End Of Treatment Note  Diagnoses: C34.31-Malignant neoplasm of lower lobe, right bronchus or lung  Cancer Staging: Clinical stage IA right lower lobe lung cancer with enlargement of right lower lobe nodule (oligometastasis)  Intent: Palliative  Radiation Treatment Dates: 06/28/2019 through 07/05/2019 Site Technique Total Dose (Gy) Dose per Fx (Gy) Completed Fx Beam Energies  Lung, Right: Lung_Rt IMRT 54/54 18 3/3 6XFFF   Narrative: The patient tolerated radiation therapy relatively well. He did report occasional heartburn for which he was taking Pepto Bismol as needed. He denied any pain, shortness of breath, and skin problems.  Plan: The patient will follow-up with radiation oncology in one month.  ________________________________________________   Blair Promise, PhD, MD  This document serves as a record of services personally performed by Gery Pray, MD. It was created on his behalf by Clerance Lav, a trained medical scribe. The creation of this record is based on the scribe's personal observations and the provider's statements to them. This document has been checked and approved by the attending provider.

## 2019-08-13 ENCOUNTER — Encounter: Payer: Self-pay | Admitting: Radiation Oncology

## 2019-08-13 ENCOUNTER — Other Ambulatory Visit: Payer: Self-pay

## 2019-08-13 ENCOUNTER — Ambulatory Visit
Admission: RE | Admit: 2019-08-13 | Discharge: 2019-08-13 | Disposition: A | Discharge status: Home / Self Care | Payer: No Typology Code available for payment source | Source: Ambulatory Visit | Attending: Radiation Oncology | Admitting: Radiation Oncology

## 2019-08-13 VITALS — BP 147/81 | HR 95 | Temp 98.7°F | Resp 20 | Ht 72.0 in | Wt 211.2 lb

## 2019-08-13 DIAGNOSIS — Z923 Personal history of irradiation: Secondary | ICD-10-CM | POA: Diagnosis not present

## 2019-08-13 DIAGNOSIS — C3431 Malignant neoplasm of lower lobe, right bronchus or lung: Secondary | ICD-10-CM | POA: Diagnosis present

## 2019-08-13 NOTE — Progress Notes (Signed)
Patient presents today for a follow-up for lung cancer. Patient denies having any pain. Patient states having mild fatigue. Patient states having a productive cough with clear sputum. Patient denies hemoptysis. Patient states having shortness of breath on exertion. Patient denies any painful or difficult swallowing. Patient states no skin changes since radiation treatment. Patient states that he is not on chemotherapy treatments.   BP (!) 147/81 (BP Location: Left Arm, Patient Position: Sitting, Cuff Size: Large)   Pulse 95   Temp 98.7 F (37.1 C)   Resp 20   Ht 6' (1.829 m)   Wt 211 lb 3.2 oz (95.8 kg)   SpO2 95%   BMI 28.64 kg/m

## 2019-08-13 NOTE — Progress Notes (Signed)
°  Radiation Oncology         [phone] ________________________________  Name: Xavier Jackson MRN: [id number]  Date: 08/13/2019  DOB: 06-23-1955  Follow-Up Visit Note  CC: System, Pcp Not In  Alcide Evener, MD    ICD-10-CM   1. Malignant neoplasm of lower lobe of right lung ([place])  C34.31     Diagnosis:  Clinicalstage IA right lower lobe lung cancer with enlargement of right lower lobe nodule (oligometastasis)  Interval Since Last Radiation: One month, one week, and two days.  Radiation Treatment Dates: 06/28/2019 through 07/05/2019 Site Technique Total Dose (Gy) Dose per Fx (Gy) Completed Fx Beam Energies  Lung, Right: Lung_Rt IMRT 54/54 18 3/3 6XFFF   Narrative:  The patient returns today for routine follow-up. No significant interval history since the end of treatment.  On review of systems, he reports mild fatigue, a productive cough with clear sputum, and shortness of breath with exertion. He denies pain, hemoptysis, pain when swallowing, difficulty swallowing, and skin changes.  ALLERGIES:  has No Known Allergies.  Meds: Current Outpatient Medications  Medication Sig Dispense Refill   albuterol (PROVENTIL HFA;VENTOLIN HFA) 108 (90 Base) MCG/ACT inhaler Inhale 2 puffs into the lungs 4 (four) times daily.     alprostadil (EDEX) 40 MCG injection 40 mcg by Intracavitary route as needed for erectile dysfunction. use no more than 3 times per week     Meclizine HCl 25 MG CHEW Chew 1 tablet by mouth 3 (three) times daily.     Multiple Vitamin (QUINTABS) TABS Take by mouth.     thiamine 100 MG tablet Take by mouth.     No current facility-administered medications for this encounter.    Physical Findings: The patient is in no acute distress. Patient is alert and oriented.  height is 6' (1.829 m) and weight is 211 lb 3.2 oz (95.8 kg). His temperature is 98.7 F (37.1 C). His blood pressure is 147/81 (abnormal) and his pulse is 95. His respiration is 20 and oxygen  saturation is 95%.  No significant changes. Lungs are clear to auscultation bilaterally. Heart has regular rate and rhythm. No palpable cervical, supraclavicular, or axillary adenopathy. Abdomen soft, non-tender, normal bowel sounds.   Lab Findings: No results found for: WBC, HGB, HCT, MCV, PLT  Radiographic Findings: No results found.  Impression: Clinicalstage IA right lower lobe lung cancer with enlargement of right lower lobe nodule (oligometastasis)  The patient is recovering from the effects of radiation.  Overall he tolerated his radiation therapy well  Plan: The patient will follow-up with radiation oncology in a as needed basis.  We will continue to follow-up at the [facility] and will have a chest CT scan in 3 to 4 months.  I have requested follow-up on this chest CT scan once available.    ____________________________________   Blair Promise, PhD, MD  This document serves as a record of services personally performed by Gery Pray, MD. It was created on his behalf by Clerance Lav, a trained medical scribe. The creation of this record is based on the scribe's personal observations and the provider's statements to them. This document has been checked and approved by the attending provider.

## 2019-08-17 ENCOUNTER — Other Ambulatory Visit (HOSPITAL_COMMUNITY): Payer: Self-pay | Admitting: Physician Assistant

## 2019-08-17 ENCOUNTER — Other Ambulatory Visit (HOSPITAL_COMMUNITY): Payer: Self-pay | Admitting: Interventional Radiology

## 2019-08-17 DIAGNOSIS — C3490 Malignant neoplasm of unspecified part of unspecified bronchus or lung: Secondary | ICD-10-CM

## 2019-08-21 ENCOUNTER — Other Ambulatory Visit: Payer: Self-pay | Admitting: Radiation Oncology

## 2019-08-21 ENCOUNTER — Ambulatory Visit
Admission: RE | Admit: 2019-08-21 | Discharge: 2019-08-21 | Disposition: A | Discharge status: Home / Self Care | Payer: Self-pay | Source: Ambulatory Visit | Attending: Radiation Oncology | Admitting: Radiation Oncology

## 2019-08-21 ENCOUNTER — Other Ambulatory Visit: Payer: Self-pay | Admitting: Physician Assistant

## 2019-08-21 ENCOUNTER — Other Ambulatory Visit (HOSPITAL_COMMUNITY): Payer: Self-pay | Admitting: Physician Assistant

## 2019-08-21 DIAGNOSIS — C3490 Malignant neoplasm of unspecified part of unspecified bronchus or lung: Secondary | ICD-10-CM

## 2019-08-21 DIAGNOSIS — M7591 Shoulder lesion, unspecified, right shoulder: Secondary | ICD-10-CM

## 2019-08-22 ENCOUNTER — Encounter (HOSPITAL_COMMUNITY): Payer: Self-pay

## 2019-08-22 NOTE — Progress Notes (Unsigned)
Xavier Jackson. Xavier Jackson Male, 64 y.o., 05-06-1955 MRN:  [id number] Phone:  [phone] (H) PCP:  System, Pcp Not In Primary Cvg:  [facility]  RE: Biopsy Received: Today Message Details  Arne Cleveland, MD  Lenore Cordia [facility]   CT core biopsy R scapula bone lesion (bone bx needle)  Fredirick Lathe [phone] [facility]    [facility]   Previous Messages  ----- Message -----  From: Lenore Cordia  Sent: 08/22/2019  2:58 PM EDT  To: Arne Cleveland, MD  Subject: RE: Biopsy                    Hello  I am getting calls from Ada from the [place].  What should I tell her?  Regarding [name]'s Biopsy.  ----- Message -----  From: Arne Cleveland, MD  Sent: 08/18/2019  8:30 AM EDT  To: Gery Pray, MD, Lenore Cordia  Subject: FW: Biopsy                    Dr Sondra Come:   We have received the below request for biopsy on your patient from provider Dahlia Client, a Opp in [place].  THis request is apparently based on a PET from OSH dated 05/07/19 (images in PACS but no report).  Would you have any additional info to help direct care in this patient?  The lesion is a difficult biopsy and sample will require decalcification which limits molecular testing options.   Thanks  Arne Cleveland  IR   ----- Message -----  From: Lenore Cordia  Sent: 08/17/2019 10:37 AM EDT  To: Ir Procedure Requests  Subject: Biopsy                      Procedure Requested: Biopsy    Reason for Procedure: There is an infiltrative T1 hypointense and T2 hyperintense ill-defined bony lesion in the right acromion, concerning for metastatic disease  please biopsy right acromion and obtain core biopsy for molecular testing , ct or US guided with our without sedations, at discretion of performing radiologist     Provider Requesting: Fredirick Lathe  Provider Telephone: [phone]    Other Info:

## 2019-08-28 ENCOUNTER — Other Ambulatory Visit: Payer: Self-pay | Admitting: Student

## 2019-08-29 ENCOUNTER — Other Ambulatory Visit: Payer: Self-pay

## 2019-08-29 ENCOUNTER — Ambulatory Visit (HOSPITAL_COMMUNITY)
Admission: RE | Admit: 2019-08-29 | Discharge: 2019-08-29 | Disposition: A | Discharge status: Home / Self Care | Payer: No Typology Code available for payment source | Source: Ambulatory Visit | Attending: Physician Assistant | Admitting: Physician Assistant

## 2019-08-29 DIAGNOSIS — C3490 Malignant neoplasm of unspecified part of unspecified bronchus or lung: Secondary | ICD-10-CM | POA: Diagnosis not present

## 2019-08-29 MED ORDER — KETOROLAC TROMETHAMINE 30 MG/ML IJ SOLN
INTRAMUSCULAR | Status: AC
  Filled 2019-08-29: qty 1

## 2019-08-29 MED ORDER — FENTANYL CITRATE (PF) 100 MCG/2ML IJ SOLN
INTRAMUSCULAR | Status: AC
  Filled 2019-08-29: qty 2

## 2019-08-29 MED ORDER — FENTANYL CITRATE (PF) 100 MCG/2ML IJ SOLN
INTRAMUSCULAR | Status: AC | PRN
  Administered 2019-08-29 (×4): 25 ug via INTRAVENOUS
  Administered 2019-08-29: 50 ug via INTRAVENOUS

## 2019-08-29 MED ORDER — KETOROLAC TROMETHAMINE 30 MG/ML IJ SOLN
INTRAMUSCULAR | Status: AC | PRN
  Administered 2019-08-29: 30 mg via INTRAVENOUS

## 2019-08-29 MED ORDER — SODIUM CHLORIDE 0.9 % IV SOLN: INTRAVENOUS | Status: DC

## 2019-08-29 MED ORDER — MIDAZOLAM HCL 2 MG/2ML IJ SOLN
INTRAMUSCULAR | Status: AC | PRN
  Administered 2019-08-29 (×3): 1 mg via INTRAVENOUS

## 2019-08-29 MED ORDER — MIDAZOLAM HCL 2 MG/2ML IJ SOLN
INTRAMUSCULAR | Status: AC
  Filled 2019-08-29: qty 2

## 2019-08-29 NOTE — Procedures (Signed)
Interventional Radiology Procedure Note  Procedure: Ct guided biopsy of right scapula/acromion FDG avid lesion, core bone bx. .  Complications: None  Recommendations:  - Ok to shower tomorrow - Do not submerge for 7 days - Routine care   Signed,  Dulcy Fanny. Earleen Newport, DO

## 2019-08-29 NOTE — Discharge Instructions (Signed)
Needle Biopsy of the Bone, Care After This sheet gives you information about how to care for yourself after your procedure. Your health care provider may also give you more specific instructions. If you have problems or questions, contact your health care provider. What can I expect after the procedure? After the procedure, it is common to have soreness or tenderness at the puncture site. Follow these instructions at home: Puncture care   Follow instructions from your health care provider about how to take care of your puncture site. Make sure you: ? Wash your hands with soap and water before and after you change your bandage (dressing). If soap and water are not available, use hand sanitizer. ? Remove your dressing as told by your health care provider. In 24 hours  Check your puncture site every day for signs of infection. Check for: ? Redness, swelling, or worsening pain. ? Fluid or blood. ? Warmth. ? Pus or a bad smell. General instructions  Take over-the-counter and prescription medicines only as told by your health care provider.  Do not drive for 24 hours if you were given a sedative during your procedure.  Return to your normal activities as told by your health care provider.  Keep all follow-up visits as told by your health care provider. This is important. Contact a health care provider if:  You have redness, swelling, or worsening pain at the site of your puncture.  You have fluid or blood coming from your puncture site.  Your puncture site feels warm to the touch.  You have pus or a bad smell coming from your puncture site.  You have a fever.  You have persistent nausea or vomiting. Get help right away if:  You develop a rash.  You have difficulty breathing. Summary  After the procedure, it is common to have soreness or tenderness at the puncture site.  Follow instructions from your health care provider about how to take care of your puncture  site.  Contact a health care provider if you have any signs of infection.  Keep all follow-up visits as told by your health care provider. This is important. This information is not intended to replace advice given to you by your health care provider. Make sure you discuss any questions you have with your health care provider. Document Revised: 09/22/2017 Document Reviewed: 09/22/2017 Elsevier Patient Education  [place].  Moderate Conscious Sedation, Adult, Care After These instructions provide you with information about caring for yourself after your procedure. Your health care provider may also give you more specific instructions. Your treatment has been planned according to current medical practices, but problems sometimes occur. Call your health care provider if you have any problems or questions after your procedure. What can I expect after the procedure? After your procedure, it is common:  To feel sleepy for several hours.  To feel clumsy and have poor balance for several hours.  To have poor judgment for several hours.  To vomit if you eat too soon. Follow these instructions at home: For at least 24 hours after the procedure:   Do not: ? Participate in activities where you could fall or become injured. ? Drive. ? Use heavy machinery. ? Drink alcohol. ? Take sleeping pills or medicines that cause drowsiness. ? Make important decisions or sign legal documents. ? Take care of children on your own.  Rest. Eating and drinking  Follow the diet recommended by your health care provider.  If you vomit: ? Drink water,  juice, or soup when you can drink without vomiting. ? Make sure you have little or no nausea before eating solid foods. General instructions  Have a responsible adult stay with you until you are awake and alert.  Take over-the-counter and prescription medicines only as told by your health care provider.  If you smoke, do not smoke without  supervision.  Keep all follow-up visits as told by your health care provider. This is important. Contact a health care provider if:  You keep feeling nauseous or you keep vomiting.  You feel light-headed.  You develop a rash.  You have a fever. Get help right away if:  You have trouble breathing. This information is not intended to replace advice given to you by your health care provider. Make sure you discuss any questions you have with your health care provider. Document Revised: 12/24/2016 Document Reviewed: 05/03/2015 Elsevier Patient Education  2020 [employer].
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
## 2019-08-31 LAB — SURGICAL PATHOLOGY

## 2019-09-12 ENCOUNTER — Encounter: Payer: Self-pay | Admitting: *Deleted

## 2019-09-12 NOTE — Progress Notes (Signed)
I followed up with Dr. Sondra Come regarding patient's plan of care.  He updated me that patient needs to be seen by him.  I notified rad onc scheduling team to get him scheduled.

## 2019-09-14 ENCOUNTER — Telehealth: Payer: Self-pay | Admitting: Physician Assistant

## 2019-09-14 NOTE — Telephone Encounter (Signed)
Our office has received a new pt referral from the [place] for dx of lung cancer. Xavier Jackson has been scheduled to see [name] on 9/1 a1:30pm w/labs at 130pm. Pt aware to arrive 15 minutes early.

## 2019-09-14 NOTE — Progress Notes (Signed)
Patient  here for a Reconsult with Dr. Sondra Jackson.  Name: Xavier Jackson        MRN: [id number]         Date: 06/06/2019                      DOB: January 17, 1956  Re-Consultation Note  CC: System, Xavier Not In  Xavier Jackson, P*    ICD-10-CM   1. Malignant neoplasm of bronchus and lung [facility])  C34.90 Ambulatory referral to Social Work  2. Malignant neoplasm of lower lobe of right lung (HCC)  C34.31     Diagnosis: Clinical stage IA right lower lobe lung cancer wit enlargement of right lower lobe nodule (oligometastasis)  Narrative:  The patient returns today to discuss radiation treatment options. He was initially seen in consultation on 01/09/2018 and was then treated with SBRT/SRT-VMAT // 6X-FFF Photon (54 Gy in 3 fractions at 18 Gy per fraction) directed at the tumor in the right lower lobe. Treatment dates were 02/15/2018, 02/17/2018, and 02/21/2018. He tolerated radiation treatment relatively well without any signs of acute toxicity during treatment. He was seen in follow-up on 03/30/2018, during which time he was doing well overall without any reported side effects from SBRT. He was to undergo repeat chest CT scan with the [place] on 05/18/2018 and then follow-up with radiation oncology in three months. However, he did not return for follow-up.   In the interim, the patient has undergone three CT scans of his chest. Results are as follows: CT of chest on 06/19/2018 showed postsurgical changes with interval change in a ground-glass nodule in the anterior right lower lobe into a solid irregular nodule measuring 10 x 5 mm. The change was worrisome for adenocarcinoma. There were additional areas of nodularity and increased density that were unchanged without any new nodules. Finally, there was an interval increase in the 11 mm lymph node in the gastrohepatic ligament. CT of chest on 01/01/2019 showed postsurgical changes of prior left upper lobectomy and right upper lobe wedge resection. The  previously described nodular opacity related to the right upper lobe wedge resection measure 7 x 6 mm and had slowly grown compared to prior examinations; tumor was not excluded. There was also a 5 mm nodule within the superior right lower lobe that had slowly grown over multiple prior examinations; malignancy was not excluded. Finally, there were evolving postradiation changes at treated right anterior lower lobe nodule. CT of chest on 04/10/2019 showed interval enlargement of a nodule near the site of right upper lobe resection adjacent to the mediastinum that was then 9 x 6 mm as compared to 7 x 6 mm previously. Additionally, a right lower lobe nodule that previously measured 5 mm was enlarged and measured 9 mm with irregular margins, highly suspicious for malignancy. There was also a soft tissue density adjacent to the right inferior pulmonary vein that was essentially unchanged.   PET scan on 05/07/2019 showed an enlarging right lower lobe nodule with a maximum SUV of 3.4. There was a new hypermetabolic lytic lesion in the right acromion with a maximum SUV of 12.4. There was increased activity along the right lower lobe bandlike density with a maximum SUV of 2.8, which is greater than previous. Finally, there was moderate hypermetabolic activity of calcifications along the anterior omentum along with questionable small focus of accentuated activity along the left anterior inferior iliac spine.  MRI of head on 05/31/2019 was performed at the [facility] morning according  to the patient.  However we do not have the results of this brain MRI  On review of systems, the patient reports left hip pain and shortness of breath associated with COPD. He denies hemoptysis, weight changes, and any other symptoms.  Patient reports falling and breaking his left hip is chronic pain in this area.  Incident happened earlier this year   Allergies:  has No Known Allergies.  Radiographic Findings: I carefully  reviewed the patient's most recent PET scan.  I  reviewed the images with the patient and his sister highlighting the areas of concern.  Impression: Clinical stage IA right lower lobe lung cancer now now with possible metastasis in the right shoulder region.  Patient is not symptomatic from this lesion.  He does have a PET positive enlarging lesion in the right lung and I would recommend SBRT to this area.  If his brain MRI shows metastatic disease we will need to consider radiation treatment to this area.  I discussed the course of stereotactic body radiation therapy with patient, associated side effects and toxicities.  Patient is well aware of this treatment in light of his previous SBRT.   Plan:  Patient is scheduled for CT simulation in the near future.  Anticipate 3 treatments directed at the PET positive nodule in the right lung.   06/06/2019 Dr. Sondra Jackson  Component 2 wk ago    08/29/2019  SURGICAL PATHOLOGY SURGICAL PATHOLOGY  CASE: [phone]  PATIENT: Xavier Jackson  Surgical Pathology Report      Clinical History: FDG avid lesion, right acrimon/scapula, possible lung  cancer met (cm)    FINAL MICROSCOPIC DIAGNOSIS:   A. BONE, RIGHT ACRIMON/SCAPULA, BIOPSY:  - Metastatic carcinoma  - See comment   COMMENT:   By immunohistochemistry, there are a few cells positive for cytokeratin  AE1/3 and TTF-1 (focal). Overall, the immunophenotype is consistent  with the patient's history of lung adenocarcinoma. Dr. Saralyn Jackson reviewed  the case and agrees with the above diagnosis.   GROSS DESCRIPTION:   Received in formalin is a 0.8 x 0.7 x 0.15 cm aggregate of dark red soft  material with few intermixed pieces of pink-red firm gritty tissue.  Entirely submitted in 1 block following decalcification of firm gritty  tissue using Immunocal decalcifying agent.   SW 08/29/2019     Final Diagnosis performed by Xavier Sheller, MD.  Electronically signed  08/31/2019  Technical and / or  Professional components performed at [facility]. [facility], [place] [address], [place], [place] [address].  Immunohistochemistry Technical component (if applicable) was performed  at [facility]. [address], [place],  [place], [place] [address].  IMMUNOHISTOCHEMISTRY DISCLAIMER (if applicable):  Some of these immunohistochemical stains may have been developed and the  performance characteristics determine by [facility]. Some  may not have been cleared or approved by the U.S. Food and Drug  Administration. The FDA has determined that such clearance or approval  is not necessary. This test is used for clinical purposes. It should not  be regarded as investigational or for research. This laboratory is  certified under the [place]  (CLIA-88) as qualified to perform high complexity clinical laboratory  testing. The controls stained appropriately.          Past/Anticipated interventions by cardiothoracic surgery, if any:   Past/Anticipated interventions by medical oncology, if any:   Signs/Symptoms  Weight changes, if any:   Respiratory complaints, if any:  Hemoptysis, if any:   Pain issues, if any:    SAFETY ISSUES:  Prior radiation?   Pacemaker/ICD?    Possible current pregnancy?  Is the patient on methotrexate?   Current Complaints / other details:    Vs  wt

## 2019-09-15 ENCOUNTER — Ambulatory Visit: Payer: No Typology Code available for payment source | Admitting: Radiation Oncology

## 2019-09-17 ENCOUNTER — Ambulatory Visit
Admission: RE | Admit: 2019-09-17 | Discharge: 2019-09-17 | Disposition: A | Discharge status: Home / Self Care | Payer: Self-pay | Source: Ambulatory Visit | Attending: Radiation Oncology | Admitting: Radiation Oncology

## 2019-09-17 ENCOUNTER — Other Ambulatory Visit: Payer: Self-pay | Admitting: Radiation Oncology

## 2019-09-17 DIAGNOSIS — C3490 Malignant neoplasm of unspecified part of unspecified bronchus or lung: Secondary | ICD-10-CM

## 2019-09-17 NOTE — Progress Notes (Addendum)
Radiation Oncology         [phone]) [phone] ________________________________  Name: Xavier Jackson MRN: [id number]  Date: 09/19/2019  DOB: 1955-12-10  Re-Consultation Note  CC: System, Pcp Not In  Dierdre Searles, P*    ICD-10-CM   1. Malignant neoplasm of lower lobe of right lung ([place])  C34.31     Diagnosis: ClinicalstageIA right lower lobe lung cancer with enlargement of right lower lobe nodule (oligometastasis) now with metastasis to right scapula  Narrative:  The patient returns today to discuss radiation treatment options. He was initially seen in consultation on 01/09/2018 and was then treated with SBRT/SRT-VMAT // 6X-FFF Photon (54 Gy in 3 fractions at 18 Gy per fraction) directed at the tumor in the right lower lobe. Treatment dates were 02/15/2018, 02/17/2018, and 02/21/2018. He tolerated radiation treatment relatively well without any signs of acute toxicity during treatment. He was seen in follow-up on 03/30/2018, during which time he was doing well overall without any reported side effects from SBRT.   Following treatment, the patient underwent a chest CT scan on 04/10/2019 that had showed interval enlargement of a nodule near the site of right upper lobe resection adjacent to the mediastinum that was then 9 x 6 mm as compared to 7 x 6 mm previously. Additionally, a right lower lobe nodule that previously measured 5 mm was enlarged and measured 9 mm with irregular margins, highly suspicious for malignancy. There was also a soft tissue density adjacent to the right inferior pulmonary vein that was essentially unchanged.   PET scan on 05/07/2019 showed an enlarging right lower lobe nodule with a maximum SUV of 3.4. There was a new hypermetabolic lytic lesion in the right acromion with a maximum SUV of 12.4. There was increased activity along the right lower lobe bandlike density with a maximum SUV of 2.8, which is greater than previous. Finally, there was moderate  hypermetabolic activity of calcifications along the anterior omentum along with questionable small focus of accentuated activity along the left anterior inferior iliac spine.  Given the above findings, the patient was seen in consultation again on 06/06/2019 and was then treated with SBRT (54 Gy in 3 fractions // 6XFFF) directed at the right lung from 06/28/2019 - 07/05/2019. He was seen in follow-up on 08/13/2019, during which time he was recovering well form the effects of radiation. He was to follow-up with radiation oncology on an as-needed basis.   Most recently, the patient underwent an MRI of the pelvis on 08/17/2019 was limited secondary to left total hip arthroplasty with susceptibility artifact, but no definite suspicious lesions were noted in the interior inferior iliac spine corresponding to the focus of hypermetabolic activity noted on 05/07/2019 PET scan. MRI of right shoulder on that same day showed an infiltrative T1 hypointense and T2 hyperintense ill-defined bony lesion in the right acromion that was suspicious for bony metastatic disease given the patient's known lung cancer diagnosis.  This area measures 3.5 x 1.6 x 1.6 cm with surrounding edema.  When correlated with outside PET/CT dated May 07, 2019 there was a subtle lytic lesion in this area with hypermetabolic activity.  CT biopsy of possible metastasis to the right acromion/scapula on 08/29/2019 revealed metastatic carcinoma. Overall, the immunophenotype was consistent with the patient's history of lung adenocarcinoma.   On review of systems, the patient reports a productive cough with white phlegm, shortness of breath with exertion, and having palpated a painful area under his left axilla several days ago that has  since resolved. He denies chest pain, hemoptysis, pain with swallowing, difficulty swallowing, headache, dizziness, nausea, vomiting, diarrhea, and any other symptoms.  He reports having intermittent pain in the right  shoulder area over the past few months.  He denies any other areas of pain.  He denies any headaches or visual problems.   Allergies:  has No Known Allergies.  Meds: No current outpatient medications on file.   No current facility-administered medications for this encounter.    Physical Findings: The patient is in no acute distress. Patient is alert and oriented.  Accompanied by his sister on evaluation today.  height is 6' (1.829 m) and weight is 210 lb (95.3 kg). His oral temperature is 97.8 F (36.6 C). His blood pressure is 113/73 and his pulse is 100. His respiration is 20 and oxygen saturation is 95%.  Lungs are clear to auscultation bilaterally. Heart has regular rate and rhythm. No palpable cervical, supraclavicular, or axillary adenopathy. Abdomen soft, non-tender, normal bowel sounds.  No palpable mass or discomfort in the left axillary region.  No palpable mass or point tenderness along the right shoulder or scapular area  Lab Findings: No results found for: WBC, HGB, HCT, MCV, PLT  Radiographic Findings: CT BIOPSY  Result Date: 08/30/2019 INDICATION: 64 year old male with possible metastasis to the right acromion/scapula. EXAM: CT BIOPSY MEDICATIONS: None. ANESTHESIA/SEDATION: Moderate (conscious) sedation was employed during this procedure. A total of Versed 3.0 mg and Fentanyl 200 mcg was administered intravenously. Moderate Sedation Time: 25 minutes. The patient's level of consciousness and vital signs were monitored continuously by radiology nursing throughout the procedure under my direct supervision. FLUOROSCOPY TIME:  CT COMPLICATIONS: None PROCEDURE: Informed written consent was obtained from the patient after a thorough discussion of the procedural risks, benefits and alternatives. All questions were addressed. Maximal Sterile Barrier Technique was utilized including caps, mask, sterile gowns, sterile gloves, sterile drape, hand hygiene and skin antiseptic. A timeout was  performed prior to the initiation of the procedure. Patient positioned supine position on the CT gantry table. Scout CT of the right shoulder performed for planning purposes. The patient is prepped and draped in the usual sterile fashion. 1% lidocaine was used for local anesthesia. Using CT guidance, Murphy needle was advanced to the lytic lesion in the right scapula. Once we confirmed needle tip position, a coaxial 13 gauge core biopsy was performed and then a final 11 gauge core biopsy. Specimen placed into formalin. Sterile bandage was placed. Patient tolerated the procedure well and remained hemodynamically stable throughout. No complications were encountered and no significant blood loss. IMPRESSION: Status post CT-guided biopsy of right scapular lesion. Tissue specimen sent to pathology for complete histopathologic analysis. Signed, Dulcy Fanny. Dellia Nims, RPVI [facility] Radiology Electronically Signed   By: Corrie Mckusick D.O.   On: 08/30/2019 12:02    Impression: ClinicalstageIA right lower lobe lung cancer with enlargement of right lower lobe nodule (oligometastasis) status post SBRT, now with isolated metastasis to right acromion (oligometastasis).   Since this is only area of active disease and patient is having intermittent pain in this area I would recommend radiation therapy to this area.  In addition the lesion is rather large at 3.5 cm.  Plan: The patient is scheduled to see Cassandra Heilingoetter, PA-C, on 09/26/2019 for medical oncology consultation.  I am unsure if the patient can have additional testing on the biopsy material from the right shoulder to help guide potential targeted therapy.  The patient will  return on July 31 for CT simulation with treatments to begin approximately a week later.  Anticipate 10 treatments to the area of concern.  Total time spent in this encounter was 35 minutes which included reviewing the patient's most  recent MRI of pelvis, MRI of right shoulder, CT biopsy of right scapula, physical examination, and documentation.  -----------------------------------  Blair Promise, PhD, MD  This document serves as a record of services personally performed by Gery Pray, MD. It was created on his behalf by Clerance Lav, a trained medical scribe. The creation of this record is based on the scribe's personal observations and the provider's statements to them. This document has been checked and approved by the attending provider.

## 2019-09-18 ENCOUNTER — Ambulatory Visit
Admission: RE | Admit: 2019-09-18 | Discharge: 2019-09-18 | Disposition: A | Discharge status: Home / Self Care | Payer: Self-pay | Source: Ambulatory Visit | Attending: Radiation Oncology | Admitting: Radiation Oncology

## 2019-09-18 ENCOUNTER — Other Ambulatory Visit: Payer: Self-pay | Admitting: Radiation Oncology

## 2019-09-18 DIAGNOSIS — C3490 Malignant neoplasm of unspecified part of unspecified bronchus or lung: Secondary | ICD-10-CM

## 2019-09-19 ENCOUNTER — Encounter: Payer: Self-pay | Admitting: Radiation Oncology

## 2019-09-19 ENCOUNTER — Ambulatory Visit: Payer: No Typology Code available for payment source | Admitting: Radiation Oncology

## 2019-09-19 ENCOUNTER — Other Ambulatory Visit: Payer: Self-pay

## 2019-09-19 ENCOUNTER — Ambulatory Visit
Admission: RE | Admit: 2019-09-19 | Discharge: 2019-09-19 | Disposition: A | Discharge status: Home / Self Care | Payer: Medicare Other | Source: Ambulatory Visit | Attending: Radiation Oncology | Admitting: Radiation Oncology

## 2019-09-19 VITALS — BP 113/73 | HR 100 | Temp 97.8°F | Resp 20 | Ht 72.0 in | Wt 210.0 lb

## 2019-09-19 DIAGNOSIS — C3431 Malignant neoplasm of lower lobe, right bronchus or lung: Secondary | ICD-10-CM

## 2019-09-19 DIAGNOSIS — Z923 Personal history of irradiation: Secondary | ICD-10-CM | POA: Insufficient documentation

## 2019-09-19 DIAGNOSIS — C7951 Secondary malignant neoplasm of bone: Secondary | ICD-10-CM | POA: Diagnosis not present

## 2019-09-19 HISTORY — DX: Malignant neoplasm of unspecified part of unspecified bronchus or lung: C34.90

## 2019-09-19 NOTE — Progress Notes (Signed)
Weight and vitals stable. Denies pain. Reports a productive cough with white phlegm. Denies hemoptysis. Reports shortness of breath with exertion. Denies pain or difficulty associated with swallowing. Reports a normal appetite. Denies headache, dizziness, nausea, vomiting or diarrhea. Reports several days ago he palpated a painful area under his left axilla but doesn't feel it today.   BP 113/73   Pulse 100   Temp 97.8 F (36.6 C) (Oral)   Resp 20   Ht 6' (1.829 m)   Wt 210 lb (95.3 kg)   SpO2 95%   BMI 28.48 kg/m  Wt Readings from Last 3 Encounters:  09/19/19 210 lb (95.3 kg)  08/29/19 211 lb (95.7 kg)  08/13/19 211 lb 3.2 oz (95.8 kg)
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
## 2019-09-25 ENCOUNTER — Telehealth: Payer: Self-pay | Admitting: Physician Assistant

## 2019-09-25 ENCOUNTER — Ambulatory Visit
Admission: RE | Admit: 2019-09-25 | Discharge: 2019-09-25 | Disposition: A | Discharge status: Home / Self Care | Payer: No Typology Code available for payment source | Source: Ambulatory Visit | Attending: Radiation Oncology | Admitting: Radiation Oncology

## 2019-09-25 ENCOUNTER — Other Ambulatory Visit: Payer: Self-pay

## 2019-09-25 DIAGNOSIS — C3431 Malignant neoplasm of lower lobe, right bronchus or lung: Secondary | ICD-10-CM | POA: Insufficient documentation

## 2019-09-25 NOTE — Telephone Encounter (Signed)
Rescheduled appts on 9/1 to 9/3. Pt unable to come in on 9/1 due to transportation. R/s radiation appt to 9:50 on 9/3 per pt request. Pt is aware of appt time and date.

## 2019-09-25 NOTE — Telephone Encounter (Signed)
Rescheduled appts on 9/1 to 9/3. Pt is aware of appt times rescheduled starting at 8:25.

## 2019-09-26 ENCOUNTER — Inpatient Hospital Stay: Payer: No Typology Code available for payment source | Admitting: Physician Assistant

## 2019-09-26 ENCOUNTER — Ambulatory Visit: Payer: No Typology Code available for payment source | Admitting: Radiation Oncology

## 2019-09-26 ENCOUNTER — Inpatient Hospital Stay: Payer: No Typology Code available for payment source

## 2019-09-26 ENCOUNTER — Ambulatory Visit: Payer: No Typology Code available for payment source

## 2019-09-26 DIAGNOSIS — C3431 Malignant neoplasm of lower lobe, right bronchus or lung: Secondary | ICD-10-CM | POA: Insufficient documentation

## 2019-09-26 NOTE — Progress Notes (Signed)
  Radiation Oncology         [phone]) [phone] ________________________________  Name: Xavier Jackson MRN: [id number]  Date: 09/27/2019  DOB: 07-12-1955  Simulation Verification Note    ICD-10-CM   1. Malignant neoplasm of bronchus and lung ([place])  C34.90     NARRATIVE: The patient was brought to the treatment unit and placed in the planned treatment position. The clinical setup was verified. Then port films were obtained and uploaded to the radiation oncology medical record software.  The treatment beams were carefully compared against the planned radiation fields. The position location and shape of the radiation fields was reviewed. The targeted volume of tissue appears to be appropriately covered by the radiation beams. Organs at risk appear to be excluded as planned.  Based on my personal review, I approved the simulation verification. The patient's treatment will proceed as planned.  -----------------------------------  Blair Promise, PhD, MD  This document serves as a record of services personally performed by Gery Pray, MD. It was created on his behalf by Clerance Lav, a trained medical scribe. The creation of this record is based on the scribe's personal observations and the provider's statements to them. This document has been checked and approved by the attending provider.

## 2019-09-27 ENCOUNTER — Ambulatory Visit
Admission: RE | Admit: 2019-09-27 | Discharge: 2019-09-27 | Disposition: A | Discharge status: Home / Self Care | Payer: No Typology Code available for payment source | Source: Ambulatory Visit | Attending: Radiation Oncology | Admitting: Radiation Oncology

## 2019-09-27 ENCOUNTER — Other Ambulatory Visit: Payer: Self-pay | Admitting: Physician Assistant

## 2019-09-27 ENCOUNTER — Other Ambulatory Visit: Payer: Self-pay

## 2019-09-27 DIAGNOSIS — C3490 Malignant neoplasm of unspecified part of unspecified bronchus or lung: Secondary | ICD-10-CM

## 2019-09-27 DIAGNOSIS — C3431 Malignant neoplasm of lower lobe, right bronchus or lung: Secondary | ICD-10-CM | POA: Diagnosis not present
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
## 2019-09-28 ENCOUNTER — Other Ambulatory Visit: Payer: Self-pay

## 2019-09-28 ENCOUNTER — Inpatient Hospital Stay: Payer: No Typology Code available for payment source | Attending: Physician Assistant

## 2019-09-28 ENCOUNTER — Encounter: Payer: Self-pay | Admitting: Physician Assistant

## 2019-09-28 ENCOUNTER — Inpatient Hospital Stay: Payer: No Typology Code available for payment source

## 2019-09-28 ENCOUNTER — Ambulatory Visit
Admission: RE | Admit: 2019-09-28 | Discharge: 2019-09-28 | Disposition: A | Discharge status: Home / Self Care | Payer: No Typology Code available for payment source | Source: Ambulatory Visit | Attending: Radiation Oncology | Admitting: Radiation Oncology

## 2019-09-28 ENCOUNTER — Inpatient Hospital Stay (HOSPITAL_BASED_OUTPATIENT_CLINIC_OR_DEPARTMENT_OTHER): Payer: No Typology Code available for payment source | Admitting: Physician Assistant

## 2019-09-28 ENCOUNTER — Other Ambulatory Visit: Payer: No Typology Code available for payment source

## 2019-09-28 ENCOUNTER — Ambulatory Visit: Payer: No Typology Code available for payment source | Admitting: Physician Assistant

## 2019-09-28 VITALS — BP 115/87 | HR 80 | Temp 97.1°F | Resp 17 | Ht 72.0 in | Wt 211.5 lb

## 2019-09-28 DIAGNOSIS — C3431 Malignant neoplasm of lower lobe, right bronchus or lung: Secondary | ICD-10-CM | POA: Diagnosis present

## 2019-09-28 DIAGNOSIS — B1920 Unspecified viral hepatitis C without hepatic coma: Secondary | ICD-10-CM

## 2019-09-28 DIAGNOSIS — Z79899 Other long term (current) drug therapy: Secondary | ICD-10-CM | POA: Insufficient documentation

## 2019-09-28 DIAGNOSIS — N529 Male erectile dysfunction, unspecified: Secondary | ICD-10-CM

## 2019-09-28 DIAGNOSIS — J449 Chronic obstructive pulmonary disease, unspecified: Secondary | ICD-10-CM | POA: Diagnosis not present

## 2019-09-28 DIAGNOSIS — Z902 Acquired absence of lung [part of]: Secondary | ICD-10-CM | POA: Diagnosis not present

## 2019-09-28 DIAGNOSIS — Z96642 Presence of left artificial hip joint: Secondary | ICD-10-CM

## 2019-09-28 DIAGNOSIS — Z8042 Family history of malignant neoplasm of prostate: Secondary | ICD-10-CM

## 2019-09-28 DIAGNOSIS — Z5111 Encounter for antineoplastic chemotherapy: Secondary | ICD-10-CM | POA: Diagnosis not present

## 2019-09-28 DIAGNOSIS — C3490 Malignant neoplasm of unspecified part of unspecified bronchus or lung: Secondary | ICD-10-CM

## 2019-09-28 DIAGNOSIS — C7951 Secondary malignant neoplasm of bone: Secondary | ICD-10-CM | POA: Insufficient documentation

## 2019-09-28 DIAGNOSIS — R7989 Other specified abnormal findings of blood chemistry: Secondary | ICD-10-CM

## 2019-09-28 DIAGNOSIS — K5790 Diverticulosis of intestine, part unspecified, without perforation or abscess without bleeding: Secondary | ICD-10-CM

## 2019-09-28 LAB — CBC WITH DIFFERENTIAL (CANCER CENTER ONLY)
Abs Immature Granulocytes: 0.03 10*3/uL (ref 0.00–0.07)
Basophils Absolute: 0.1 10*3/uL (ref 0.0–0.1)
Basophils Relative: 1 %
Eosinophils Absolute: 0.1 10*3/uL (ref 0.0–0.5)
Eosinophils Relative: 1 %
HCT: 54.8 % — ABNORMAL HIGH (ref 39.0–52.0)
Hemoglobin: 17.7 g/dL — ABNORMAL HIGH (ref 13.0–17.0)
Immature Granulocytes: 0 %
Lymphocytes Relative: 28 %
Lymphs Abs: 1.9 10*3/uL (ref 0.7–4.0)
MCH: 31.9 pg (ref 26.0–34.0)
MCHC: 32.3 g/dL (ref 30.0–36.0)
MCV: 98.7 fL (ref 80.0–100.0)
Monocytes Absolute: 0.8 10*3/uL (ref 0.1–1.0)
Monocytes Relative: 12 %
Neutro Abs: 3.9 10*3/uL (ref 1.7–7.7)
Neutrophils Relative %: 58 %
Platelet Count: 282 10*3/uL (ref 150–400)
RBC: 5.55 MIL/uL (ref 4.22–5.81)
RDW: 14.1 % (ref 11.5–15.5)
WBC Count: 6.8 10*3/uL (ref 4.0–10.5)
nRBC: 0 % (ref 0.0–0.2)

## 2019-09-28 LAB — CMP (CANCER CENTER ONLY)
ALT: 139 U/L — ABNORMAL HIGH (ref 0–44)
AST: 96 U/L — ABNORMAL HIGH (ref 15–41)
Albumin: 3.9 g/dL (ref 3.5–5.0)
Alkaline Phosphatase: 97 U/L (ref 38–126)
Anion gap: 5 (ref 5–15)
BUN: 9 mg/dL (ref 8–23)
CO2: 32 mmol/L (ref 22–32)
Calcium: 10.1 mg/dL (ref 8.9–10.3)
Chloride: 102 mmol/L (ref 98–111)
Creatinine: 0.81 mg/dL (ref 0.61–1.24)
GFR, Est AFR Am: >60 mL/min (ref >60)
GFR, Estimated: >60 mL/min (ref >60)
Glucose, Bld: 100 mg/dL — ABNORMAL HIGH (ref 70–99)
Potassium: 5 mmol/L (ref 3.5–5.1)
Sodium: 139 mmol/L (ref 135–145)
Total Bilirubin: 0.8 mg/dL (ref 0.3–1.2)
Total Protein: 8.6 g/dL — ABNORMAL HIGH (ref 6.5–8.1)

## 2019-10-02 ENCOUNTER — Other Ambulatory Visit: Payer: Self-pay

## 2019-10-02 ENCOUNTER — Ambulatory Visit
Admission: RE | Admit: 2019-10-02 | Discharge: 2019-10-02 | Disposition: A | Discharge status: Home / Self Care | Payer: No Typology Code available for payment source | Source: Ambulatory Visit | Attending: Radiation Oncology | Admitting: Radiation Oncology

## 2019-10-02 DIAGNOSIS — C3431 Malignant neoplasm of lower lobe, right bronchus or lung: Secondary | ICD-10-CM | POA: Diagnosis not present

## 2019-10-03 ENCOUNTER — Ambulatory Visit
Admission: RE | Admit: 2019-10-03 | Discharge: 2019-10-03 | Disposition: A | Discharge status: Home / Self Care | Payer: No Typology Code available for payment source | Source: Ambulatory Visit | Attending: Radiation Oncology | Admitting: Radiation Oncology

## 2019-10-03 ENCOUNTER — Other Ambulatory Visit: Payer: Self-pay

## 2019-10-03 DIAGNOSIS — C3431 Malignant neoplasm of lower lobe, right bronchus or lung: Secondary | ICD-10-CM | POA: Diagnosis not present

## 2019-10-04 ENCOUNTER — Ambulatory Visit
Admission: RE | Admit: 2019-10-04 | Discharge: 2019-10-04 | Disposition: A | Discharge status: Home / Self Care | Payer: No Typology Code available for payment source | Source: Ambulatory Visit | Attending: Radiation Oncology | Admitting: Radiation Oncology

## 2019-10-04 ENCOUNTER — Other Ambulatory Visit: Payer: Self-pay

## 2019-10-04 ENCOUNTER — Telehealth: Payer: Self-pay | Admitting: Internal Medicine

## 2019-10-04 DIAGNOSIS — C3431 Malignant neoplasm of lower lobe, right bronchus or lung: Secondary | ICD-10-CM | POA: Diagnosis not present

## 2019-10-04 NOTE — Telephone Encounter (Signed)
Scheduled per los. Called and spoke with patient. Confirmed appt 

## 2019-10-05 ENCOUNTER — Ambulatory Visit
Admission: RE | Admit: 2019-10-05 | Discharge: 2019-10-05 | Disposition: A | Discharge status: Home / Self Care | Payer: No Typology Code available for payment source | Source: Ambulatory Visit | Attending: Radiation Oncology | Admitting: Radiation Oncology

## 2019-10-05 ENCOUNTER — Other Ambulatory Visit: Payer: Self-pay

## 2019-10-05 DIAGNOSIS — C3431 Malignant neoplasm of lower lobe, right bronchus or lung: Secondary | ICD-10-CM | POA: Diagnosis not present

## 2019-10-08 ENCOUNTER — Other Ambulatory Visit: Payer: Self-pay

## 2019-10-08 ENCOUNTER — Ambulatory Visit
Admission: RE | Admit: 2019-10-08 | Discharge: 2019-10-08 | Disposition: A | Discharge status: Home / Self Care | Payer: No Typology Code available for payment source | Source: Ambulatory Visit | Attending: Radiation Oncology | Admitting: Radiation Oncology

## 2019-10-08 DIAGNOSIS — C3431 Malignant neoplasm of lower lobe, right bronchus or lung: Secondary | ICD-10-CM | POA: Diagnosis not present

## 2019-10-09 ENCOUNTER — Ambulatory Visit
Admission: RE | Admit: 2019-10-09 | Discharge: 2019-10-09 | Disposition: A | Discharge status: Home / Self Care | Payer: No Typology Code available for payment source | Source: Ambulatory Visit | Attending: Radiation Oncology | Admitting: Radiation Oncology

## 2019-10-09 ENCOUNTER — Other Ambulatory Visit: Payer: Self-pay

## 2019-10-09 DIAGNOSIS — C3431 Malignant neoplasm of lower lobe, right bronchus or lung: Secondary | ICD-10-CM | POA: Diagnosis not present

## 2019-10-10 ENCOUNTER — Ambulatory Visit
Admission: RE | Admit: 2019-10-10 | Discharge: 2019-10-10 | Disposition: A | Discharge status: Home / Self Care | Payer: No Typology Code available for payment source | Source: Ambulatory Visit | Attending: Radiation Oncology | Admitting: Radiation Oncology

## 2019-10-10 ENCOUNTER — Other Ambulatory Visit: Payer: Self-pay

## 2019-10-10 DIAGNOSIS — C3431 Malignant neoplasm of lower lobe, right bronchus or lung: Secondary | ICD-10-CM | POA: Diagnosis not present

## 2019-10-11 ENCOUNTER — Ambulatory Visit
Admission: RE | Admit: 2019-10-11 | Discharge: 2019-10-11 | Disposition: A | Discharge status: Home / Self Care | Payer: No Typology Code available for payment source | Source: Ambulatory Visit | Attending: Radiation Oncology | Admitting: Radiation Oncology

## 2019-10-11 ENCOUNTER — Other Ambulatory Visit: Payer: Self-pay

## 2019-10-11 ENCOUNTER — Encounter: Payer: Self-pay | Admitting: Radiation Oncology

## 2019-10-11 DIAGNOSIS — C3431 Malignant neoplasm of lower lobe, right bronchus or lung: Secondary | ICD-10-CM | POA: Diagnosis not present

## 2019-10-12 ENCOUNTER — Encounter: Payer: Self-pay | Admitting: Internal Medicine

## 2019-10-15 ENCOUNTER — Telehealth: Payer: Self-pay | Admitting: Internal Medicine

## 2019-10-15 ENCOUNTER — Encounter: Payer: Self-pay | Admitting: Internal Medicine

## 2019-10-15 ENCOUNTER — Other Ambulatory Visit: Payer: Self-pay

## 2019-10-15 ENCOUNTER — Inpatient Hospital Stay (HOSPITAL_BASED_OUTPATIENT_CLINIC_OR_DEPARTMENT_OTHER): Payer: No Typology Code available for payment source | Admitting: Internal Medicine

## 2019-10-15 ENCOUNTER — Encounter: Payer: Self-pay | Admitting: *Deleted

## 2019-10-15 VITALS — BP 112/81 | HR 95 | Temp 97.4°F | Resp 19 | Ht 72.0 in | Wt 212.0 lb

## 2019-10-15 DIAGNOSIS — Z5112 Encounter for antineoplastic immunotherapy: Secondary | ICD-10-CM

## 2019-10-15 DIAGNOSIS — C3431 Malignant neoplasm of lower lobe, right bronchus or lung: Secondary | ICD-10-CM | POA: Diagnosis not present

## 2019-10-15 DIAGNOSIS — Z5111 Encounter for antineoplastic chemotherapy: Secondary | ICD-10-CM | POA: Diagnosis not present

## 2019-10-15 DIAGNOSIS — C3490 Malignant neoplasm of unspecified part of unspecified bronchus or lung: Secondary | ICD-10-CM

## 2019-10-15 DIAGNOSIS — Z7189 Other specified counseling: Secondary | ICD-10-CM | POA: Insufficient documentation

## 2019-10-15 LAB — GUARDANT 360

## 2019-10-15 MED ORDER — VALACYCLOVIR HCL 500 MG PO TABS: 500.0000 mg | ORAL_TABLET | Freq: Two times a day (BID) | ORAL | 0 refills | Status: AC

## 2019-10-15 MED ORDER — CYANOCOBALAMIN 1000 MCG/ML IJ SOLN
INTRAMUSCULAR | Status: AC
  Filled 2019-10-15: qty 1

## 2019-10-15 MED ORDER — CYANOCOBALAMIN 1000 MCG/ML IJ SOLN
1000.0000 ug | Freq: Once | INTRAMUSCULAR | Status: AC
  Administered 2019-10-15: 1000 ug via INTRAMUSCULAR

## 2019-10-15 NOTE — Progress Notes (Signed)
START ON PATHWAY REGIMEN - Non-Small Cell Lung     A cycle is every 21 days:     Pembrolizumab      Pemetrexed      Carboplatin   **Always confirm dose/schedule in your pharmacy ordering system**  Patient Characteristics: Stage IV Metastatic, Nonsquamous, Initial Chemotherapy/Immunotherapy, PS = 0, 1, ALK Rearrangement Negative and ROS1 Rearrangement Negative and NTRK Gene Fusion?Negative and RET Gene Fusion?Negative and EGFR Mutation Negative, PD-L1 Expression Positive  1-49% (TPS) / Negative / Not Tested / Awaiting Test Results and Immunotherapy Candidate Therapeutic Status: Stage IV Metastatic Histology: Nonsquamous Cell ROS1 Rearrangement Status: Negative Other Mutations/Biomarkers: No Other Actionable Mutations Chemotherapy/Immunotherapy LOT: Initial Chemotherapy/Immunotherapy Molecular Targeted Therapy: Not Appropriate KRAS G12C Mutation Status: Negative MET Exon 14 Mutation Status: Negative RET Gene Fusion Status: Negative EGFR Mutation Status: Negative/Wild Type NTRK Gene Fusion Status: Negative PD-L1 Expression Status: Quantity Not Sufficient ALK Rearrangement Status: Negative BRAF V600E Mutation Status: Negative ECOG Performance Status: 1 Biomarker Assessment Status Confirmation: All Genomic Markers Negative, or Only MET+ or BRAF+ or KRAS G12C+ Immunotherapy Candidate Status: Candidate for Immunotherapy Intent of Therapy: Non-Curative / Palliative Intent, Discussed with Patient 

## 2019-10-15 NOTE — Progress Notes (Signed)
Oncology Nurse Navigator Documentation  Oncology Nurse Navigator Flowsheets 10/15/2019  Confirmed Diagnosis Date 08/29/2019  Diagnosis Status Confirmed Diagnosis Complete  Planned Course of Treatment Chemotherapy;Targeted Therapy  Phase of Treatment Targeted Therapy  Navigator Follow Up Date: 10/17/2019  Navigator Follow Up Reason: Appointment Review  Navigator Location [facility]  Navigator Encounter Type Clinic/MDC  Patient Visit Type MedOnc  Treatment Phase Pre-Tx/Tx Discussion  Barriers/Navigation Needs Education  Education Newly Diagnosed Cancer Education;Understanding Cancer/ Treatment Options;Other  Interventions Education;Psycho-Social Support  Acuity Level 2-Minimal Needs (1-2 Barriers Identified)  Education Method Verbal;Written  Support Groups/Services Other  Time Spent with Patient 30

## 2019-10-15 NOTE — Telephone Encounter (Signed)
Scheduled per 9/20 los. Printed avs and calendar for pt.
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
## 2019-10-18 ENCOUNTER — Inpatient Hospital Stay: Payer: No Typology Code available for payment source

## 2019-10-18 ENCOUNTER — Other Ambulatory Visit: Payer: Self-pay | Admitting: Physician Assistant

## 2019-10-18 ENCOUNTER — Other Ambulatory Visit: Payer: Self-pay

## 2019-10-18 DIAGNOSIS — C3431 Malignant neoplasm of lower lobe, right bronchus or lung: Secondary | ICD-10-CM

## 2019-10-18 MED ORDER — PROCHLORPERAZINE MALEATE 10 MG PO TABS: 10.0000 mg | ORAL_TABLET | Freq: Four times a day (QID) | ORAL | 0 refills | Status: AC | PRN

## 2019-10-18 MED ORDER — PROCHLORPERAZINE MALEATE 10 MG PO TABS: 10.0000 mg | ORAL_TABLET | Freq: Four times a day (QID) | ORAL | 2 refills | Status: DC | PRN

## 2019-10-22 ENCOUNTER — Inpatient Hospital Stay: Payer: No Typology Code available for payment source

## 2019-10-22 ENCOUNTER — Other Ambulatory Visit: Payer: Self-pay

## 2019-10-22 VITALS — BP 127/74 | HR 76 | Temp 98.3°F | Resp 18

## 2019-10-22 DIAGNOSIS — Z5111 Encounter for antineoplastic chemotherapy: Secondary | ICD-10-CM | POA: Diagnosis not present

## 2019-10-22 DIAGNOSIS — C3490 Malignant neoplasm of unspecified part of unspecified bronchus or lung: Secondary | ICD-10-CM

## 2019-10-22 DIAGNOSIS — C3431 Malignant neoplasm of lower lobe, right bronchus or lung: Secondary | ICD-10-CM

## 2019-10-22 LAB — CMP (CANCER CENTER ONLY)
ALT: 107 U/L — ABNORMAL HIGH (ref 0–44)
AST: 70 U/L — ABNORMAL HIGH (ref 15–41)
Albumin: 3.6 g/dL (ref 3.5–5.0)
Alkaline Phosphatase: 70 U/L (ref 38–126)
Anion gap: 6 (ref 5–15)
BUN: 7 mg/dL — ABNORMAL LOW (ref 8–23)
CO2: 27 mmol/L (ref 22–32)
Calcium: 10.2 mg/dL (ref 8.9–10.3)
Chloride: 106 mmol/L (ref 98–111)
Creatinine: 0.72 mg/dL (ref 0.61–1.24)
GFR, Est AFR Am: >60 mL/min (ref >60)
GFR, Estimated: >60 mL/min (ref >60)
Glucose, Bld: 97 mg/dL (ref 70–99)
Potassium: 4.3 mmol/L (ref 3.5–5.1)
Sodium: 139 mmol/L (ref 135–145)
Total Bilirubin: 0.6 mg/dL (ref 0.3–1.2)
Total Protein: 8 g/dL (ref 6.5–8.1)

## 2019-10-22 LAB — CBC WITH DIFFERENTIAL (CANCER CENTER ONLY)
Abs Immature Granulocytes: 0.02 10*3/uL (ref 0.00–0.07)
Basophils Absolute: 0.1 10*3/uL (ref 0.0–0.1)
Basophils Relative: 1 %
Eosinophils Absolute: 0.1 10*3/uL (ref 0.0–0.5)
Eosinophils Relative: 1 %
HCT: 49.7 % (ref 39.0–52.0)
Hemoglobin: 16.6 g/dL (ref 13.0–17.0)
Immature Granulocytes: 0 %
Lymphocytes Relative: 28 %
Lymphs Abs: 1.9 10*3/uL (ref 0.7–4.0)
MCH: 31.6 pg (ref 26.0–34.0)
MCHC: 33.4 g/dL (ref 30.0–36.0)
MCV: 94.5 fL (ref 80.0–100.0)
Monocytes Absolute: 0.9 10*3/uL (ref 0.1–1.0)
Monocytes Relative: 13 %
Neutro Abs: 4 10*3/uL (ref 1.7–7.7)
Neutrophils Relative %: 57 %
Platelet Count: 178 10*3/uL (ref 150–400)
RBC: 5.26 MIL/uL (ref 4.22–5.81)
RDW: 13.3 % (ref 11.5–15.5)
WBC Count: 7 10*3/uL (ref 4.0–10.5)
nRBC: 0 % (ref 0.0–0.2)

## 2019-10-22 LAB — TSH: TSH: 2.758 u[IU]/mL (ref 0.320–4.118)

## 2019-10-22 MED ORDER — SODIUM CHLORIDE 0.9 % IV SOLN
10.0000 mg | Freq: Once | INTRAVENOUS | Status: AC
  Administered 2019-10-22: 10 mg via INTRAVENOUS
  Filled 2019-10-22: qty 10

## 2019-10-22 MED ORDER — SODIUM CHLORIDE 0.9 % IV SOLN
150.0000 mg | Freq: Once | INTRAVENOUS | Status: AC
  Administered 2019-10-22: 150 mg via INTRAVENOUS
  Filled 2019-10-22: qty 150

## 2019-10-22 MED ORDER — PALONOSETRON HCL INJECTION 0.25 MG/5ML
INTRAVENOUS | Status: AC
  Filled 2019-10-22: qty 5

## 2019-10-22 MED ORDER — SODIUM CHLORIDE 0.9 % IV SOLN
500.0000 mg/m2 | Freq: Once | INTRAVENOUS | Status: AC
  Administered 2019-10-22: 1100 mg via INTRAVENOUS
  Filled 2019-10-22: qty 40

## 2019-10-22 MED ORDER — SODIUM CHLORIDE 0.9 % IV SOLN
620.0000 mg | Freq: Once | INTRAVENOUS | Status: AC
  Administered 2019-10-22: 620 mg via INTRAVENOUS
  Filled 2019-10-22: qty 62

## 2019-10-22 MED ORDER — SODIUM CHLORIDE 0.9 % IV SOLN
Freq: Once | INTRAVENOUS | Status: AC
  Filled 2019-10-22: qty 250

## 2019-10-22 MED ORDER — PALONOSETRON HCL INJECTION 0.25 MG/5ML
0.2500 mg | Freq: Once | INTRAVENOUS | Status: AC
  Administered 2019-10-22: 0.25 mg via INTRAVENOUS

## 2019-10-22 MED ORDER — SODIUM CHLORIDE 0.9 % IV SOLN
200.0000 mg | Freq: Once | INTRAVENOUS | Status: AC
  Administered 2019-10-22: 200 mg via INTRAVENOUS
  Filled 2019-10-22: qty 8

## 2019-10-22 NOTE — Patient Instructions (Signed)
[facility] Discharge Instructions for Patients Receiving Chemotherapy  Today you received the following chemotherapy agents keytruda; alimta; carboplatin  To help prevent nausea and vomiting after your treatment, we encourage you to take your nausea medication as directed   If you develop nausea and vomiting that is not controlled by your nausea medication, call the clinic.   BELOW ARE SYMPTOMS THAT SHOULD BE REPORTED IMMEDIATELY:  *FEVER GREATER THAN 100.5 F  *CHILLS WITH OR WITHOUT FEVER  NAUSEA AND VOMITING THAT IS NOT CONTROLLED WITH YOUR NAUSEA MEDICATION  *UNUSUAL SHORTNESS OF BREATH  *UNUSUAL BRUISING OR BLEEDING  TENDERNESS IN MOUTH AND THROAT WITH OR WITHOUT PRESENCE OF ULCERS  *URINARY PROBLEMS  *BOWEL PROBLEMS  UNUSUAL RASH Items with * indicate a potential emergency and should be followed up as soon as possible.  Feel free to call the clinic should you have any questions or concerns. The clinic phone number is [phone].  Please show the [facility] at check-in to the Emergency Department and triage nurse.  Pembrolizumab injection What is this medicine? PEMBROLIZUMAB (pem broe liz ue mab) is a monoclonal antibody. It is used to treat certain types of cancer. This medicine may be used for other purposes; ask your health care provider or pharmacist if you have questions. COMMON BRAND NAME(S): Keytruda What should I tell my health care provider before I take this medicine? They need to know if you have any of these conditions:  diabetes  immune system problems  inflammatory bowel disease  liver disease  lung or breathing disease  lupus  received or scheduled to receive an organ transplant or a stem-cell transplant that uses donor stem cells  an unusual or allergic reaction to pembrolizumab, other medicines, foods, dyes, or preservatives  pregnant or trying to get pregnant  breast-feeding How should I use this  medicine? This medicine is for infusion into a vein. It is given by a health care professional in a hospital or clinic setting. A special MedGuide will be given to you before each treatment. Be sure to read this information carefully each time. Talk to your pediatrician regarding the use of this medicine in children. While this drug may be prescribed for children as young as 6 months for selected conditions, precautions do apply. Overdosage: If you think you have taken too much of this medicine contact a poison control center or emergency room at once. NOTE: This medicine is only for you. Do not share this medicine with others. What if I miss a dose? It is important not to miss your dose. Call your doctor or health care professional if you are unable to keep an appointment. What may interact with this medicine? Interactions have not been studied. Give your health care provider a list of all the medicines, herbs, non-prescription drugs, or dietary supplements you use. Also tell them if you smoke, drink alcohol, or use illegal drugs. Some items may interact with your medicine. This list may not describe all possible interactions. Give your health care provider a list of all the medicines, herbs, non-prescription drugs, or dietary supplements you use. Also tell them if you smoke, drink alcohol, or use illegal drugs. Some items may interact with your medicine. What should I watch for while using this medicine? Your condition will be monitored carefully while you are receiving this medicine. You may need blood work done while you are taking this medicine. Do not become pregnant while taking this medicine or for 4 months after stopping it.  Women should inform their doctor if they wish to become pregnant or think they might be pregnant. There is a potential for serious side effects to an unborn child. Talk to your health care professional or pharmacist for more information. Do not breast-feed an infant while  taking this medicine or for 4 months after the last dose. What side effects may I notice from receiving this medicine? Side effects that you should report to your doctor or health care professional as soon as possible:  allergic reactions like skin rash, itching or hives, swelling of the face, lips, or tongue  bloody or black, tarry  breathing problems  changes in vision  chest pain  chills  confusion  constipation  cough  diarrhea  dizziness or feeling faint or lightheaded  fast or irregular heartbeat  fever  flushing  joint pain  low blood counts - this medicine may decrease the number of white blood cells, red blood cells and platelets. You may be at increased risk for infections and bleeding.  muscle pain  muscle weakness  pain, tingling, numbness in the hands or feet  persistent headache  redness, blistering, peeling or loosening of the skin, including inside the mouth  signs and symptoms of high blood sugar such as dizziness; dry mouth; dry skin; fruity breath; nausea; stomach pain; increased hunger or thirst; increased urination  signs and symptoms of kidney injury like trouble passing urine or change in the amount of urine  signs and symptoms of liver injury like dark urine, light-colored stools, loss of appetite, nausea, right upper belly pain, yellowing of the eyes or skin  sweating  swollen lymph nodes  weight loss Side effects that usually do not require medical attention (report to your doctor or health care professional if they continue or are bothersome):  decreased appetite  hair loss  muscle pain  tiredness This list may not describe all possible side effects. Call your doctor for medical advice about side effects. You may report side effects to FDA at 1-800-FDA-1088. Where should I keep my medicine? This drug is given in a hospital or clinic and will not be stored at home. NOTE: This sheet is a summary. It may not cover all  possible information. If you have questions about this medicine, talk to your doctor, pharmacist, or health care provider.  2020 Elsevier/Gold Standard (2018-11-17 18:07:58)  Pemetrexed injection What is this medicine? PEMETREXED (PEM e TREX ed) is a chemotherapy drug used to treat lung cancers like non-small cell lung cancer and mesothelioma. It may also be used to treat other cancers. This medicine may be used for other purposes; ask your health care provider or pharmacist if you have questions. COMMON BRAND NAME(S): Alimta What should I tell my health care provider before I take this medicine? They need to know if you have any of these conditions:  infection (especially a virus infection such as chickenpox, cold sores, or herpes)  kidney disease  low blood counts, like low white cell, platelet, or red cell counts  lung or breathing disease, like asthma  radiation therapy  an unusual or allergic reaction to pemetrexed, other medicines, foods, dyes, or preservative  pregnant or trying to get pregnant  breast-feeding How should I use this medicine? This drug is given as an infusion into a vein. It is administered in a hospital or clinic by a specially trained health care professional. Talk to your pediatrician regarding the use of this medicine in children. Special care may be needed. Overdosage: If  you think you have taken too much of this medicine contact a poison control center or emergency room at once. NOTE: This medicine is only for you. Do not share this medicine with others. What if I miss a dose? It is important not to miss your dose. Call your doctor or health care professional if you are unable to keep an appointment. What may interact with this medicine? This medicine may interact with the following medications:  Ibuprofen This list may not describe all possible interactions. Give your health care provider a list of all the medicines, herbs, non-prescription drugs,  or dietary supplements you use. Also tell them if you smoke, drink alcohol, or use illegal drugs. Some items may interact with your medicine. What should I watch for while using this medicine? Visit your doctor for checks on your progress. This drug may make you feel generally unwell. This is not uncommon, as chemotherapy can affect healthy cells as well as cancer cells. Report any side effects. Continue your course of treatment even though you feel ill unless your doctor tells you to stop. In some cases, you may be given additional medicines to help with side effects. Follow all directions for their use. Call your doctor or health care professional for advice if you get a fever, chills or sore throat, or other symptoms of a cold or flu. Do not treat yourself. This drug decreases your body's ability to fight infections. Try to avoid being around people who are sick. This medicine may increase your risk to bruise or bleed. Call your doctor or health care professional if you notice any unusual bleeding. Be careful brushing and flossing your teeth or using a toothpick because you may get an infection or bleed more easily. If you have any dental work done, tell your dentist you are receiving this medicine. Avoid taking products that contain aspirin, acetaminophen, ibuprofen, naproxen, or ketoprofen unless instructed by your doctor. These medicines may hide a fever. Call your doctor or health care professional if you get diarrhea or mouth sores. Do not treat yourself. To protect your kidneys, drink water or other fluids as directed while you are taking this medicine. Do not become pregnant while taking this medicine or for 6 months after stopping it. Women should inform their doctor if they wish to become pregnant or think they might be pregnant. Men should not father a child while taking this medicine and for 3 months after stopping it. This may interfere with the ability to father a child. You should talk to  your doctor or health care professional if you are concerned about your fertility. There is a potential for serious side effects to an unborn child. Talk to your health care professional or pharmacist for more information. Do not breast-feed an infant while taking this medicine or for 1 week after stopping it. What side effects may I notice from receiving this medicine? Side effects that you should report to your doctor or health care professional as soon as possible:  allergic reactions like skin rash, itching or hives, swelling of the face, lips, or tongue  breathing problems  redness, blistering, peeling or loosening of the skin, including inside the mouth  signs and symptoms of bleeding such as bloody or black, tarry stools; red or dark-brown urine; spitting up blood or brown material that looks like coffee grounds; red spots on the skin; unusual bruising or bleeding from the eye, gums, or nose  signs and symptoms of infection like fever or chills; cough;  sore throat; pain or trouble passing urine  signs and symptoms of kidney injury like trouble passing urine or change in the amount of urine  signs and symptoms of liver injury like dark yellow or brown urine; general ill feeling or flu-like symptoms; light-colored stools; loss of appetite; nausea; right upper belly pain; unusually weak or tired; yellowing of the eyes or skin Side effects that usually do not require medical attention (report to your doctor or health care professional if they continue or are bothersome):  constipation  mouth sores  nausea, vomiting  unusually weak or tired This list may not describe all possible side effects. Call your doctor for medical advice about side effects. You may report side effects to FDA at 1-800-FDA-1088. Where should I keep my medicine? This drug is given in a hospital or clinic and will not be stored at home. NOTE: This sheet is a summary. It may not cover all possible information. If  you have questions about this medicine, talk to your doctor, pharmacist, or health care provider.  2020 Elsevier/Gold Standard (2017-03-02 16:11:33)  Carboplatin injection What is this medicine? CARBOPLATIN (KAR boe pla tin) is a chemotherapy drug. It targets fast dividing cells, like cancer cells, and causes these cells to die. This medicine is used to treat ovarian cancer and many other cancers. This medicine may be used for other purposes; ask your health care provider or pharmacist if you have questions. COMMON BRAND NAME(S): Paraplatin What should I tell my health care provider before I take this medicine? They need to know if you have any of these conditions:  blood disorders  hearing problems  kidney disease  recent or ongoing radiation therapy  an unusual or allergic reaction to carboplatin, cisplatin, other chemotherapy, other medicines, foods, dyes, or preservatives  pregnant or trying to get pregnant  breast-feeding How should I use this medicine? This drug is usually given as an infusion into a vein. It is administered in a hospital or clinic by a specially trained health care professional. Talk to your pediatrician regarding the use of this medicine in children. Special care may be needed. Overdosage: If you think you have taken too much of this medicine contact a poison control center or emergency room at once. NOTE: This medicine is only for you. Do not share this medicine with others. What if I miss a dose? It is important not to miss a dose. Call your doctor or health care professional if you are unable to keep an appointment. What may interact with this medicine?  medicines for seizures  medicines to increase blood counts like filgrastim, pegfilgrastim, sargramostim  some antibiotics like amikacin, gentamicin, neomycin, streptomycin, tobramycin  vaccines Talk to your doctor or health care professional before taking any of these  medicines:  acetaminophen  aspirin  ibuprofen  ketoprofen  naproxen This list may not describe all possible interactions. Give your health care provider a list of all the medicines, herbs, non-prescription drugs, or dietary supplements you use. Also tell them if you smoke, drink alcohol, or use illegal drugs. Some items may interact with your medicine. What should I watch for while using this medicine? Your condition will be monitored carefully while you are receiving this medicine. You will need important blood work done while you are taking this medicine. This drug may make you feel generally unwell. This is not uncommon, as chemotherapy can affect healthy cells as well as cancer cells. Report any side effects. Continue your course of treatment even though you  feel ill unless your doctor tells you to stop. In some cases, you may be given additional medicines to help with side effects. Follow all directions for their use. Call your doctor or health care professional for advice if you get a fever, chills or sore throat, or other symptoms of a cold or flu. Do not treat yourself. This drug decreases your body's ability to fight infections. Try to avoid being around people who are sick. This medicine may increase your risk to bruise or bleed. Call your doctor or health care professional if you notice any unusual bleeding. Be careful brushing and flossing your teeth or using a toothpick because you may get an infection or bleed more easily. If you have any dental work done, tell your dentist you are receiving this medicine. Avoid taking products that contain aspirin, acetaminophen, ibuprofen, naproxen, or ketoprofen unless instructed by your doctor. These medicines may hide a fever. Do not become pregnant while taking this medicine. Women should inform their doctor if they wish to become pregnant or think they might be pregnant. There is a potential for serious side effects to an unborn child. Talk  to your health care professional or pharmacist for more information. Do not breast-feed an infant while taking this medicine. What side effects may I notice from receiving this medicine? Side effects that you should report to your doctor or health care professional as soon as possible:  allergic reactions like skin rash, itching or hives, swelling of the face, lips, or tongue  signs of infection - fever or chills, cough, sore throat, pain or difficulty passing urine  signs of decreased platelets or bleeding - bruising, pinpoint red spots on the skin, black, tarry stools, nosebleeds  signs of decreased red blood cells - unusually weak or tired, fainting spells, lightheadedness  breathing problems  changes in hearing  changes in vision  chest pain  high blood pressure  low blood counts - This drug may decrease the number of white blood cells, red blood cells and platelets. You may be at increased risk for infections and bleeding.  nausea and vomiting  pain, swelling, redness or irritation at the injection site  pain, tingling, numbness in the hands or feet  problems with balance, talking, walking  trouble passing urine or change in the amount of urine Side effects that usually do not require medical attention (report to your doctor or health care professional if they continue or are bothersome):  hair loss  loss of appetite  metallic taste in the mouth or changes in taste This list may not describe all possible side effects. Call your doctor for medical advice about side effects. You may report side effects to FDA at 1-800-FDA-1088. Where should I keep my medicine? This drug is given in a hospital or clinic and will not be stored at home. NOTE: This sheet is a summary. It may not cover all possible information. If you have questions about this medicine, talk to your doctor, pharmacist, or health care provider.  2020 Elsevier/Gold Standard (2007-04-18 14:38:05)

## 2019-10-23 ENCOUNTER — Telehealth: Payer: Self-pay | Admitting: *Deleted

## 2019-10-29 ENCOUNTER — Inpatient Hospital Stay: Payer: No Typology Code available for payment source

## 2019-10-29 ENCOUNTER — Other Ambulatory Visit: Payer: Self-pay

## 2019-10-29 ENCOUNTER — Encounter: Payer: Self-pay | Admitting: Internal Medicine

## 2019-10-29 ENCOUNTER — Encounter: Payer: Self-pay | Admitting: *Deleted

## 2019-10-29 ENCOUNTER — Inpatient Hospital Stay: Payer: No Typology Code available for payment source | Attending: Internal Medicine | Admitting: Internal Medicine

## 2019-10-29 VITALS — BP 116/81 | HR 75 | Temp 98.7°F | Resp 17 | Ht 72.0 in | Wt 214.8 lb

## 2019-10-29 DIAGNOSIS — Z5112 Encounter for antineoplastic immunotherapy: Secondary | ICD-10-CM | POA: Insufficient documentation

## 2019-10-29 DIAGNOSIS — Z5111 Encounter for antineoplastic chemotherapy: Secondary | ICD-10-CM | POA: Diagnosis not present

## 2019-10-29 DIAGNOSIS — Z23 Encounter for immunization: Secondary | ICD-10-CM | POA: Insufficient documentation

## 2019-10-29 DIAGNOSIS — C3431 Malignant neoplasm of lower lobe, right bronchus or lung: Secondary | ICD-10-CM | POA: Diagnosis not present

## 2019-10-29 DIAGNOSIS — C7951 Secondary malignant neoplasm of bone: Secondary | ICD-10-CM | POA: Insufficient documentation

## 2019-10-29 DIAGNOSIS — Z79899 Other long term (current) drug therapy: Secondary | ICD-10-CM | POA: Insufficient documentation

## 2019-10-29 LAB — CBC WITH DIFFERENTIAL (CANCER CENTER ONLY)
Abs Immature Granulocytes: 0.02 10*3/uL (ref 0.00–0.07)
Basophils Absolute: 0 10*3/uL (ref 0.0–0.1)
Basophils Relative: 1 %
Eosinophils Absolute: 0.1 10*3/uL (ref 0.0–0.5)
Eosinophils Relative: 3 %
HCT: 47.4 % (ref 39.0–52.0)
Hemoglobin: 15.9 g/dL (ref 13.0–17.0)
Immature Granulocytes: 1 %
Lymphocytes Relative: 40 %
Lymphs Abs: 1.5 10*3/uL (ref 0.7–4.0)
MCH: 32.1 pg (ref 26.0–34.0)
MCHC: 33.5 g/dL (ref 30.0–36.0)
MCV: 95.6 fL (ref 80.0–100.0)
Monocytes Absolute: 0.4 10*3/uL (ref 0.1–1.0)
Monocytes Relative: 10 %
Neutro Abs: 1.8 10*3/uL (ref 1.7–7.7)
Neutrophils Relative %: 45 %
Platelet Count: 144 10*3/uL — ABNORMAL LOW (ref 150–400)
RBC: 4.96 MIL/uL (ref 4.22–5.81)
RDW: 13.2 % (ref 11.5–15.5)
WBC Count: 3.8 10*3/uL — ABNORMAL LOW (ref 4.0–10.5)
nRBC: 0 % (ref 0.0–0.2)

## 2019-10-29 LAB — CMP (CANCER CENTER ONLY)
ALT: 184 U/L — ABNORMAL HIGH (ref 0–44)
AST: 111 U/L — ABNORMAL HIGH (ref 15–41)
Albumin: 3.6 g/dL (ref 3.5–5.0)
Alkaline Phosphatase: 78 U/L (ref 38–126)
Anion gap: 7 (ref 5–15)
BUN: 12 mg/dL (ref 8–23)
CO2: 31 mmol/L (ref 22–32)
Calcium: 10.5 mg/dL — ABNORMAL HIGH (ref 8.9–10.3)
Chloride: 99 mmol/L (ref 98–111)
Creatinine: 0.76 mg/dL (ref 0.61–1.24)
GFR, Est AFR Am: >60 mL/min (ref >60)
GFR, Estimated: >60 mL/min (ref >60)
Glucose, Bld: 82 mg/dL (ref 70–99)
Potassium: 4.5 mmol/L (ref 3.5–5.1)
Sodium: 137 mmol/L (ref 135–145)
Total Bilirubin: 1.2 mg/dL (ref 0.3–1.2)
Total Protein: 8 g/dL (ref 6.5–8.1)

## 2019-10-29 LAB — TSH: TSH: 2.965 u[IU]/mL (ref 0.320–4.118)
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
## 2019-10-29 NOTE — Progress Notes (Signed)
Oncology Nurse Navigator Documentation  Oncology Nurse Navigator Flowsheets 10/29/2019  Confirmed Diagnosis Date -  Diagnosis Status -  Planned Course of Treatment Chemotherapy  Phase of Treatment Radiation  Chemotherapy Actual Start Date: 10/18/2019  Radiation Actual Start Date: 09/27/2019  Radiation Expected End Date: 10/11/2019  Targeted Therapy Actual Start Date: 10/18/2019  Navigator Follow Up Date: 10/29/2019  Navigator Follow Up Reason: Follow-up Appointment  Navigator Location [facility]  Navigator Encounter Type Clinic/MDC  Treatment Initiated Date 09/27/2019  Patient Visit Type MedOnc  Treatment Phase Treatment  Barriers/Navigation Needs Education  Education Other  Interventions Education;Psycho-Social Support  Acuity Level 2-Minimal Needs (1-2 Barriers Identified)  Education Method Verbal  Support Groups/Services -  Time Spent with Patient 15

## 2019-10-29 NOTE — Progress Notes (Signed)
Met with patient at registration to introduce myself as Financial Resource Specialist and to offer available resources.  Discussed one-time $1000 [employer] grant and qualifications to assist with personal expenses while going through treatment.  Gave him my card if interested in applying and for any additional financial questions or concerns.  

## 2019-10-31 NOTE — Progress Notes (Incomplete)
  Patient Name: Xavier Jackson MRN: [id number] DOB: Jan 15, 1956 Referring Physician: Genia Del Date of Service: 10/11/2019 [facility], [place]                                                        End Of Treatment Note  Diagnoses: C34.31-Malignant neoplasm of lower lobe, right bronchus or lung C79.51-Secondary malignant neoplasm of bone  Cancer Staging: ClinicalstageIA right lower lobe lung cancer with enlargement of right lower lobe nodule (oligometastasis) now with metastasis to right scapula  Intent: Palliative  Radiation Treatment Dates: 09/27/2019 through 10/11/2019 Site Technique Total Dose (Gy) Dose per Fx (Gy) Completed Fx Beam Energies  Scapula, Right: Chest_Rt 3D 30/30 3 10/10 6X, 10X   Narrative: The patient tolerated radiation therapy relatively well. He did report shortness of breath with exertion and a productive cough with clear phlegm. He denied pain, skin changes, and difficulty swallowing. There was noted to be some slight erythema to the right shoulder area. No skin breakdown. Overall, he experienced less discomfort in his right shoulder, which was particularly noticeable with movement of his right arm.  Plan: The patient will follow-up with radiation oncology in one month.  ________________________________________________   Blair Promise, PhD, MD  This document serves as a record of services personally performed by Gery Pray, MD. It was created on his behalf by Clerance Lav, a trained medical scribe. The creation of this record is based on the scribe's personal observations and the provider's statements to them. This document has been checked and approved by the attending provider.

## 2019-11-02 ENCOUNTER — Encounter: Payer: Self-pay | Admitting: *Deleted

## 2019-11-02 NOTE — Progress Notes (Signed)
I called [facility] to get an  Update on testing hold.  I was told that Xavier Jackson had 2 specimens and they only resulted out one.

## 2019-11-02 NOTE — Progress Notes (Signed)
Oncology Nurse Navigator Documentation  Oncology Nurse Navigator Flowsheets 11/02/2019  Confirmed Diagnosis Date -  Diagnosis Status -  Planned Course of Treatment -  Phase of Treatment -  Chemotherapy Actual Start Date: -  Radiation Actual Start Date: -  Radiation Expected End Date: -  Targeted Therapy Actual Start Date: -  Navigator Follow Up Date: 11/02/2019  Navigator Follow Up Reason: Test Results/I followed up on Mr. Febo [facility] report.  I called them and was update there is not enough tissue to complete testing for moleculars or PDL 1.  I will update Dr. Julien Nordmann.   Community education officer Type Other:  Treatment Initiated Date -  Patient Visit Type -  Treatment Phase Treatment  Barriers/Navigation Needs Coordination of Care  Education -  Interventions Coordination of Care  Acuity Level 2-Minimal Needs (1-2 Barriers Identified)  Education Method -  Support Groups/Services -  Time Spent with Patient 30

## 2019-11-05 ENCOUNTER — Inpatient Hospital Stay: Payer: No Typology Code available for payment source

## 2019-11-05 ENCOUNTER — Other Ambulatory Visit: Payer: Self-pay

## 2019-11-05 DIAGNOSIS — Z5111 Encounter for antineoplastic chemotherapy: Secondary | ICD-10-CM | POA: Diagnosis not present

## 2019-11-05 DIAGNOSIS — C3431 Malignant neoplasm of lower lobe, right bronchus or lung: Secondary | ICD-10-CM

## 2019-11-05 LAB — CMP (CANCER CENTER ONLY)
ALT: 117 U/L — ABNORMAL HIGH (ref 0–44)
AST: 71 U/L — ABNORMAL HIGH (ref 15–41)
Albumin: 3.5 g/dL (ref 3.5–5.0)
Alkaline Phosphatase: 82 U/L (ref 38–126)
Anion gap: 6 (ref 5–15)
BUN: 7 mg/dL — ABNORMAL LOW (ref 8–23)
CO2: 32 mmol/L (ref 22–32)
Calcium: 10.4 mg/dL — ABNORMAL HIGH (ref 8.9–10.3)
Chloride: 101 mmol/L (ref 98–111)
Creatinine: 0.76 mg/dL (ref 0.61–1.24)
GFR, Estimated: >60 mL/min (ref >60)
Glucose, Bld: 90 mg/dL (ref 70–99)
Potassium: 4.3 mmol/L (ref 3.5–5.1)
Sodium: 139 mmol/L (ref 135–145)
Total Bilirubin: 0.6 mg/dL (ref 0.3–1.2)
Total Protein: 7.9 g/dL (ref 6.5–8.1)

## 2019-11-05 LAB — CBC WITH DIFFERENTIAL (CANCER CENTER ONLY)
Abs Immature Granulocytes: 0.01 10*3/uL (ref 0.00–0.07)
Basophils Absolute: 0 10*3/uL (ref 0.0–0.1)
Basophils Relative: 0 %
Eosinophils Absolute: 0.1 10*3/uL (ref 0.0–0.5)
Eosinophils Relative: 2 %
HCT: 45.1 % (ref 39.0–52.0)
Hemoglobin: 15.1 g/dL (ref 13.0–17.0)
Immature Granulocytes: 0 %
Lymphocytes Relative: 39 %
Lymphs Abs: 1.8 10*3/uL (ref 0.7–4.0)
MCH: 31.9 pg (ref 26.0–34.0)
MCHC: 33.5 g/dL (ref 30.0–36.0)
MCV: 95.1 fL (ref 80.0–100.0)
Monocytes Absolute: 1 10*3/uL (ref 0.1–1.0)
Monocytes Relative: 21 %
Neutro Abs: 1.8 10*3/uL (ref 1.7–7.7)
Neutrophils Relative %: 38 %
Platelet Count: 144 10*3/uL — ABNORMAL LOW (ref 150–400)
RBC: 4.74 MIL/uL (ref 4.22–5.81)
RDW: 13.5 % (ref 11.5–15.5)
WBC Count: 4.8 10*3/uL (ref 4.0–10.5)
nRBC: 0 % (ref 0.0–0.2)

## 2019-11-05 LAB — TSH: TSH: 2.654 u[IU]/mL (ref 0.320–4.118)

## 2019-11-12 ENCOUNTER — Inpatient Hospital Stay: Payer: No Typology Code available for payment source

## 2019-11-12 ENCOUNTER — Inpatient Hospital Stay (HOSPITAL_BASED_OUTPATIENT_CLINIC_OR_DEPARTMENT_OTHER): Payer: No Typology Code available for payment source | Admitting: Internal Medicine

## 2019-11-12 ENCOUNTER — Other Ambulatory Visit: Payer: Self-pay

## 2019-11-12 ENCOUNTER — Encounter: Payer: Self-pay | Admitting: Internal Medicine

## 2019-11-12 ENCOUNTER — Encounter: Payer: Self-pay | Admitting: *Deleted

## 2019-11-12 VITALS — BP 136/77 | HR 102 | Temp 98.8°F | Resp 20 | Ht 72.0 in | Wt 224.9 lb

## 2019-11-12 VITALS — HR 90

## 2019-11-12 DIAGNOSIS — C3431 Malignant neoplasm of lower lobe, right bronchus or lung: Secondary | ICD-10-CM

## 2019-11-12 DIAGNOSIS — C3490 Malignant neoplasm of unspecified part of unspecified bronchus or lung: Secondary | ICD-10-CM

## 2019-11-12 DIAGNOSIS — Z5112 Encounter for antineoplastic immunotherapy: Secondary | ICD-10-CM | POA: Diagnosis not present

## 2019-11-12 DIAGNOSIS — Z5111 Encounter for antineoplastic chemotherapy: Secondary | ICD-10-CM | POA: Diagnosis not present

## 2019-11-12 LAB — CBC WITH DIFFERENTIAL (CANCER CENTER ONLY)
Abs Immature Granulocytes: 0.05 10*3/uL (ref 0.00–0.07)
Basophils Absolute: 0 10*3/uL (ref 0.0–0.1)
Basophils Relative: 1 %
Eosinophils Absolute: 0.1 10*3/uL (ref 0.0–0.5)
Eosinophils Relative: 1 %
HCT: 42.9 % (ref 39.0–52.0)
Hemoglobin: 14.2 g/dL (ref 13.0–17.0)
Immature Granulocytes: 1 %
Lymphocytes Relative: 35 %
Lymphs Abs: 1.6 10*3/uL (ref 0.7–4.0)
MCH: 31.8 pg (ref 26.0–34.0)
MCHC: 33.1 g/dL (ref 30.0–36.0)
MCV: 96.2 fL (ref 80.0–100.0)
Monocytes Absolute: 0.8 10*3/uL (ref 0.1–1.0)
Monocytes Relative: 19 %
Neutro Abs: 1.9 10*3/uL (ref 1.7–7.7)
Neutrophils Relative %: 43 %
Platelet Count: 266 10*3/uL (ref 150–400)
RBC: 4.46 MIL/uL (ref 4.22–5.81)
RDW: 14.5 % (ref 11.5–15.5)
WBC Count: 4.5 10*3/uL (ref 4.0–10.5)
nRBC: 0 % (ref 0.0–0.2)

## 2019-11-12 LAB — CMP (CANCER CENTER ONLY)
ALT: 82 U/L — ABNORMAL HIGH (ref 0–44)
AST: 64 U/L — ABNORMAL HIGH (ref 15–41)
Albumin: 3.3 g/dL — ABNORMAL LOW (ref 3.5–5.0)
Alkaline Phosphatase: 88 U/L (ref 38–126)
Anion gap: 4 — ABNORMAL LOW (ref 5–15)
BUN: 4 mg/dL — ABNORMAL LOW (ref 8–23)
CO2: 32 mmol/L (ref 22–32)
Calcium: 9.5 mg/dL (ref 8.9–10.3)
Chloride: 103 mmol/L (ref 98–111)
Creatinine: 0.67 mg/dL (ref 0.61–1.24)
GFR, Estimated: >60 mL/min (ref >60)
Glucose, Bld: 115 mg/dL — ABNORMAL HIGH (ref 70–99)
Potassium: 4.5 mmol/L (ref 3.5–5.1)
Sodium: 139 mmol/L (ref 135–145)
Total Bilirubin: 0.5 mg/dL (ref 0.3–1.2)
Total Protein: 7.4 g/dL (ref 6.5–8.1)

## 2019-11-12 LAB — TSH: TSH: 1.726 u[IU]/mL (ref 0.320–4.118)

## 2019-11-12 MED ORDER — PALONOSETRON HCL INJECTION 0.25 MG/5ML
0.2500 mg | Freq: Once | INTRAVENOUS | Status: AC
  Administered 2019-11-12: 0.25 mg via INTRAVENOUS

## 2019-11-12 MED ORDER — SODIUM CHLORIDE 0.9 % IV SOLN
620.0000 mg | Freq: Once | INTRAVENOUS | Status: AC
  Administered 2019-11-12: 620 mg via INTRAVENOUS
  Filled 2019-11-12: qty 62

## 2019-11-12 MED ORDER — SODIUM CHLORIDE 0.9 % IV SOLN
200.0000 mg | Freq: Once | INTRAVENOUS | Status: AC
  Administered 2019-11-12: 200 mg via INTRAVENOUS
  Filled 2019-11-12: qty 8

## 2019-11-12 MED ORDER — SODIUM CHLORIDE 0.9 % IV SOLN
Freq: Once | INTRAVENOUS | Status: AC
  Filled 2019-11-12: qty 250

## 2019-11-12 MED ORDER — SODIUM CHLORIDE 0.9 % IV SOLN
10.0000 mg | Freq: Once | INTRAVENOUS | Status: AC
  Administered 2019-11-12: 10 mg via INTRAVENOUS
  Filled 2019-11-12: qty 10

## 2019-11-12 MED ORDER — PALONOSETRON HCL INJECTION 0.25 MG/5ML
INTRAVENOUS | Status: AC
  Filled 2019-11-12: qty 5

## 2019-11-12 MED ORDER — SODIUM CHLORIDE 0.9 % IV SOLN
150.0000 mg | Freq: Once | INTRAVENOUS | Status: AC
  Administered 2019-11-12: 150 mg via INTRAVENOUS
  Filled 2019-11-12: qty 150

## 2019-11-12 MED ORDER — SODIUM CHLORIDE 0.9 % IV SOLN
500.0000 mg/m2 | Freq: Once | INTRAVENOUS | Status: AC
  Administered 2019-11-12: 1100 mg via INTRAVENOUS
  Filled 2019-11-12: qty 40
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
## 2019-11-12 NOTE — Patient Instructions (Signed)
[facility] Discharge Instructions for Patients Receiving Chemotherapy  Today you received the following chemotherapy agents: Keytruda, Alimta, Carboplatin   To help prevent nausea and vomiting after your treatment, we encourage you to take your nausea medication as directed.    If you develop nausea and vomiting that is not controlled by your nausea medication, call the clinic.   BELOW ARE SYMPTOMS THAT SHOULD BE REPORTED IMMEDIATELY:  *FEVER GREATER THAN 100.5 F  *CHILLS WITH OR WITHOUT FEVER  NAUSEA AND VOMITING THAT IS NOT CONTROLLED WITH YOUR NAUSEA MEDICATION  *UNUSUAL SHORTNESS OF BREATH  *UNUSUAL BRUISING OR BLEEDING  TENDERNESS IN MOUTH AND THROAT WITH OR WITHOUT PRESENCE OF ULCERS  *URINARY PROBLEMS  *BOWEL PROBLEMS  UNUSUAL RASH Items with * indicate a potential emergency and should be followed up as soon as possible.  Feel free to call the clinic should you have any questions or concerns. The clinic phone number is [phone].  Please show the [facility] at check-in to the Emergency Department and triage nurse.

## 2019-11-12 NOTE — Progress Notes (Signed)
Oncology Nurse Navigator Documentation  Oncology Nurse Navigator Flowsheets 11/12/2019  Confirmed Diagnosis Date -  Diagnosis Status -  Planned Course of Treatment -  Phase of Treatment -  Chemotherapy Actual Start Date: -  Radiation Actual Start Date: -  Radiation Expected End Date: -  Targeted Therapy Actual Start Date: -  Navigator Follow Up Date: 11/12/2019  Navigator Follow Up Reason: Follow-up Appointment  Navigation Complete Date: 11/12/2019  Post Navigation: Continue to Follow Patient? No  Reason Not Navigating Patient: Patient On Maintenance Chemotherapy  Navigator Location [facility]  Navigator Encounter Type Clinic/MDC  Treatment Initiated Date -  Patient Visit Type MedOnc  Treatment Phase Treatment  Barriers/Navigation Needs No Barriers At This Time  Education -  Interventions -  Acuity Level 1-No Barriers  Education Method -  Support Groups/Services -  Time Spent with Patient 15

## 2019-11-19 ENCOUNTER — Inpatient Hospital Stay: Payer: No Typology Code available for payment source

## 2019-11-19 ENCOUNTER — Other Ambulatory Visit: Payer: Self-pay

## 2019-11-19 DIAGNOSIS — Z5111 Encounter for antineoplastic chemotherapy: Secondary | ICD-10-CM | POA: Diagnosis not present

## 2019-11-19 DIAGNOSIS — C3431 Malignant neoplasm of lower lobe, right bronchus or lung: Secondary | ICD-10-CM

## 2019-11-19 LAB — CBC WITH DIFFERENTIAL (CANCER CENTER ONLY)
Abs Immature Granulocytes: 0.01 10*3/uL (ref 0.00–0.07)
Basophils Absolute: 0 10*3/uL (ref 0.0–0.1)
Basophils Relative: 1 %
Eosinophils Absolute: 0 10*3/uL (ref 0.0–0.5)
Eosinophils Relative: 1 %
HCT: 42.1 % (ref 39.0–52.0)
Hemoglobin: 14.3 g/dL (ref 13.0–17.0)
Immature Granulocytes: 1 %
Lymphocytes Relative: 46 %
Lymphs Abs: 1 10*3/uL (ref 0.7–4.0)
MCH: 31.8 pg (ref 26.0–34.0)
MCHC: 34 g/dL (ref 30.0–36.0)
MCV: 93.8 fL (ref 80.0–100.0)
Monocytes Absolute: 0.2 10*3/uL (ref 0.1–1.0)
Monocytes Relative: 9 %
Neutro Abs: 0.9 10*3/uL — ABNORMAL LOW (ref 1.7–7.7)
Neutrophils Relative %: 42 %
Platelet Count: 113 10*3/uL — ABNORMAL LOW (ref 150–400)
RBC: 4.49 MIL/uL (ref 4.22–5.81)
RDW: 14.2 % (ref 11.5–15.5)
WBC Count: 2.2 10*3/uL — ABNORMAL LOW (ref 4.0–10.5)
nRBC: 0 % (ref 0.0–0.2)

## 2019-11-19 LAB — CMP (CANCER CENTER ONLY)
ALT: 185 U/L — ABNORMAL HIGH (ref 0–44)
AST: 125 U/L — ABNORMAL HIGH (ref 15–41)
Albumin: 3.5 g/dL (ref 3.5–5.0)
Alkaline Phosphatase: 84 U/L (ref 38–126)
Anion gap: 5 (ref 5–15)
BUN: 11 mg/dL (ref 8–23)
CO2: 28 mmol/L (ref 22–32)
Calcium: 9.2 mg/dL (ref 8.9–10.3)
Chloride: 101 mmol/L (ref 98–111)
Creatinine: 0.69 mg/dL (ref 0.61–1.24)
GFR, Estimated: >60 mL/min (ref >60)
Glucose, Bld: 124 mg/dL — ABNORMAL HIGH (ref 70–99)
Potassium: 4.1 mmol/L (ref 3.5–5.1)
Sodium: 134 mmol/L — ABNORMAL LOW (ref 135–145)
Total Bilirubin: 0.6 mg/dL (ref 0.3–1.2)
Total Protein: 7.4 g/dL (ref 6.5–8.1)

## 2019-11-19 LAB — TSH: TSH: 2.163 u[IU]/mL (ref 0.320–4.118)

## 2019-11-21 ENCOUNTER — Other Ambulatory Visit: Payer: Self-pay

## 2019-11-21 ENCOUNTER — Emergency Department (HOSPITAL_COMMUNITY)
Admission: EM | Admit: 2019-11-21 | Discharge: 2019-11-21 | Disposition: A | Discharge status: Home / Self Care | Payer: No Typology Code available for payment source | Attending: Emergency Medicine | Admitting: Emergency Medicine

## 2019-11-21 ENCOUNTER — Emergency Department (HOSPITAL_COMMUNITY): Payer: No Typology Code available for payment source

## 2019-11-21 ENCOUNTER — Encounter: Payer: Self-pay | Admitting: *Deleted

## 2019-11-21 ENCOUNTER — Encounter (HOSPITAL_COMMUNITY): Payer: Self-pay | Admitting: *Deleted

## 2019-11-21 ENCOUNTER — Telehealth: Payer: Self-pay | Admitting: Emergency Medicine

## 2019-11-21 DIAGNOSIS — J449 Chronic obstructive pulmonary disease, unspecified: Secondary | ICD-10-CM | POA: Diagnosis not present

## 2019-11-21 DIAGNOSIS — F17210 Nicotine dependence, cigarettes, uncomplicated: Secondary | ICD-10-CM | POA: Insufficient documentation

## 2019-11-21 DIAGNOSIS — R140 Abdominal distension (gaseous): Secondary | ICD-10-CM | POA: Diagnosis not present

## 2019-11-21 DIAGNOSIS — Z96642 Presence of left artificial hip joint: Secondary | ICD-10-CM | POA: Diagnosis not present

## 2019-11-21 LAB — COMPREHENSIVE METABOLIC PANEL
ALT: 146 U/L — ABNORMAL HIGH (ref 0–44)
AST: 85 U/L — ABNORMAL HIGH (ref 15–41)
Albumin: 3.7 g/dL (ref 3.5–5.0)
Alkaline Phosphatase: 78 U/L (ref 38–126)
Anion gap: 11 (ref 5–15)
BUN: 6 mg/dL — ABNORMAL LOW (ref 8–23)
CO2: 26 mmol/L (ref 22–32)
Calcium: 9.2 mg/dL (ref 8.9–10.3)
Chloride: 98 mmol/L (ref 98–111)
Creatinine, Ser: 0.5 mg/dL — ABNORMAL LOW (ref 0.61–1.24)
GFR, Estimated: >60 mL/min (ref >60)
Glucose, Bld: 97 mg/dL (ref 70–99)
Potassium: 3.7 mmol/L (ref 3.5–5.1)
Sodium: 135 mmol/L (ref 135–145)
Total Bilirubin: 0.9 mg/dL (ref 0.3–1.2)
Total Protein: 7.4 g/dL (ref 6.5–8.1)

## 2019-11-21 LAB — CBC WITH DIFFERENTIAL/PLATELET
Abs Immature Granulocytes: 0.01 10*3/uL (ref 0.00–0.07)
Basophils Absolute: 0 10*3/uL (ref 0.0–0.1)
Basophils Relative: 1 %
Eosinophils Absolute: 0 10*3/uL (ref 0.0–0.5)
Eosinophils Relative: 1 %
HCT: 37.3 % — ABNORMAL LOW (ref 39.0–52.0)
Hemoglobin: 12.8 g/dL — ABNORMAL LOW (ref 13.0–17.0)
Immature Granulocytes: 0 %
Lymphocytes Relative: 52 %
Lymphs Abs: 1.9 10*3/uL (ref 0.7–4.0)
MCH: 32.5 pg (ref 26.0–34.0)
MCHC: 34.3 g/dL (ref 30.0–36.0)
MCV: 94.7 fL (ref 80.0–100.0)
Monocytes Absolute: 0.8 10*3/uL (ref 0.1–1.0)
Monocytes Relative: 21 %
Neutro Abs: 0.9 10*3/uL — ABNORMAL LOW (ref 1.7–7.7)
Neutrophils Relative %: 25 %
Platelets: 88 10*3/uL — ABNORMAL LOW (ref 150–400)
RBC: 3.94 MIL/uL — ABNORMAL LOW (ref 4.22–5.81)
RDW: 14.1 % (ref 11.5–15.5)
WBC: 3.7 10*3/uL — ABNORMAL LOW (ref 4.0–10.5)
nRBC: 0 % (ref 0.0–0.2)

## 2019-11-21 LAB — LIPASE, BLOOD: Lipase: 36 U/L (ref 11–51)

## 2019-11-21 MED ORDER — IOHEXOL 300 MG/ML  SOLN
100.0000 mL | Freq: Once | INTRAMUSCULAR | Status: AC | PRN
  Administered 2019-11-21: 100 mL via INTRAVENOUS

## 2019-11-21 NOTE — ED Triage Notes (Signed)
Pt had chemo on 10/18 last for lung cancer with bone mets. This morning pt noticed that his stomach was "swollen tight like a watermelon about to burst". He has been able to tolerate meals without nausea and vomiting and has had two normal BM. He tried taking Pepto and Gas x with no relief.

## 2019-11-21 NOTE — Progress Notes (Signed)
I followed up on the [facility] information portal and noticed that patients testing is on hold.  I contacted [facility] and they updated me that there is not enough tissue for testing. Dr. Julien Nordmann updated.

## 2019-11-21 NOTE — ED Notes (Signed)
Patient transported to CT 

## 2019-11-21 NOTE — Telephone Encounter (Signed)
Returning pt's VM requesting advisement on abdominal swelling.  Pt reports eating and drinking normally today and solid bowel movements.  Denies pain or cramping in abdomen, denies N/V or fever/chills.  Pt states that after breakfast today his stomach 'swelled like a watermelon' and has not returned to its normal size.  Pt states he cannot get a ride to the [facility] until much later tonight.  Advised that if he feels he can wait until tomorrow morning he can be seen in [facility], but that if he continues to feel bad or worsens to go to [facility].  Pt also advised that he can take Pepto Bismol as needed.  Pt verbalized understanding and denies any further questions/concerns at this time.

## 2019-11-21 NOTE — Discharge Instructions (Addendum)
Follow-up with your cancer doctors, ideally to be seen in the next 24 to 48 hours.  Return to ER if you have worsening abdominal distention, any abdominal pain, vomiting, any fever or other new concerning symptom.
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
## 2019-11-26 ENCOUNTER — Other Ambulatory Visit: Payer: Self-pay

## 2019-11-26 ENCOUNTER — Inpatient Hospital Stay: Payer: No Typology Code available for payment source | Attending: Internal Medicine

## 2019-11-26 DIAGNOSIS — Z9079 Acquired absence of other genital organ(s): Secondary | ICD-10-CM | POA: Insufficient documentation

## 2019-11-26 DIAGNOSIS — C7951 Secondary malignant neoplasm of bone: Secondary | ICD-10-CM | POA: Insufficient documentation

## 2019-11-26 DIAGNOSIS — C3412 Malignant neoplasm of upper lobe, left bronchus or lung: Secondary | ICD-10-CM | POA: Insufficient documentation

## 2019-11-26 DIAGNOSIS — C3411 Malignant neoplasm of upper lobe, right bronchus or lung: Secondary | ICD-10-CM | POA: Diagnosis present

## 2019-11-26 DIAGNOSIS — Z96652 Presence of left artificial knee joint: Secondary | ICD-10-CM | POA: Insufficient documentation

## 2019-11-26 DIAGNOSIS — C3431 Malignant neoplasm of lower lobe, right bronchus or lung: Secondary | ICD-10-CM

## 2019-11-26 DIAGNOSIS — R531 Weakness: Secondary | ICD-10-CM | POA: Diagnosis not present

## 2019-11-26 DIAGNOSIS — Z902 Acquired absence of lung [part of]: Secondary | ICD-10-CM | POA: Diagnosis not present

## 2019-11-26 DIAGNOSIS — Z79899 Other long term (current) drug therapy: Secondary | ICD-10-CM | POA: Insufficient documentation

## 2019-11-26 DIAGNOSIS — J449 Chronic obstructive pulmonary disease, unspecified: Secondary | ICD-10-CM | POA: Insufficient documentation

## 2019-11-26 DIAGNOSIS — Z8042 Family history of malignant neoplasm of prostate: Secondary | ICD-10-CM | POA: Diagnosis not present

## 2019-11-26 DIAGNOSIS — R530 Neoplastic (malignant) related fatigue: Secondary | ICD-10-CM | POA: Insufficient documentation

## 2019-11-26 LAB — CMP (CANCER CENTER ONLY)
ALT: 122 U/L — ABNORMAL HIGH (ref 0–44)
AST: 76 U/L — ABNORMAL HIGH (ref 15–41)
Albumin: 3.4 g/dL — ABNORMAL LOW (ref 3.5–5.0)
Alkaline Phosphatase: 91 U/L (ref 38–126)
Anion gap: 8 (ref 5–15)
BUN: 6 mg/dL — ABNORMAL LOW (ref 8–23)
CO2: 32 mmol/L (ref 22–32)
Calcium: 9.4 mg/dL (ref 8.9–10.3)
Chloride: 99 mmol/L (ref 98–111)
Creatinine: 0.72 mg/dL (ref 0.61–1.24)
GFR, Estimated: >60 mL/min (ref >60)
Glucose, Bld: 164 mg/dL — ABNORMAL HIGH (ref 70–99)
Potassium: 4.5 mmol/L (ref 3.5–5.1)
Sodium: 139 mmol/L (ref 135–145)
Total Bilirubin: 0.6 mg/dL (ref 0.3–1.2)
Total Protein: 7.4 g/dL (ref 6.5–8.1)

## 2019-11-26 LAB — CBC WITH DIFFERENTIAL (CANCER CENTER ONLY)
Abs Immature Granulocytes: 0.02 10*3/uL (ref 0.00–0.07)
Basophils Absolute: 0 10*3/uL (ref 0.0–0.1)
Basophils Relative: 1 %
Eosinophils Absolute: 0 10*3/uL (ref 0.0–0.5)
Eosinophils Relative: 0 %
HCT: 41.3 % (ref 39.0–52.0)
Hemoglobin: 13.5 g/dL (ref 13.0–17.0)
Immature Granulocytes: 1 %
Lymphocytes Relative: 36 %
Lymphs Abs: 1.4 10*3/uL (ref 0.7–4.0)
MCH: 31.7 pg (ref 26.0–34.0)
MCHC: 32.7 g/dL (ref 30.0–36.0)
MCV: 96.9 fL (ref 80.0–100.0)
Monocytes Absolute: 0.6 10*3/uL (ref 0.1–1.0)
Monocytes Relative: 16 %
Neutro Abs: 1.8 10*3/uL (ref 1.7–7.7)
Neutrophils Relative %: 46 %
Platelet Count: 123 10*3/uL — ABNORMAL LOW (ref 150–400)
RBC: 4.26 MIL/uL (ref 4.22–5.81)
RDW: 16.2 % — ABNORMAL HIGH (ref 11.5–15.5)
WBC Count: 3.9 10*3/uL — ABNORMAL LOW (ref 4.0–10.5)
nRBC: 0 % (ref 0.0–0.2)

## 2019-11-26 LAB — TSH: TSH: 2.881 u[IU]/mL (ref 0.320–4.118)

## 2019-12-03 ENCOUNTER — Inpatient Hospital Stay: Payer: No Typology Code available for payment source

## 2019-12-03 ENCOUNTER — Other Ambulatory Visit: Payer: Self-pay

## 2019-12-03 ENCOUNTER — Inpatient Hospital Stay (HOSPITAL_BASED_OUTPATIENT_CLINIC_OR_DEPARTMENT_OTHER): Payer: No Typology Code available for payment source | Admitting: Internal Medicine

## 2019-12-03 ENCOUNTER — Encounter: Payer: Self-pay | Admitting: Internal Medicine

## 2019-12-03 VITALS — BP 132/75 | HR 100 | Temp 98.9°F | Resp 13 | Ht 72.0 in | Wt 219.7 lb

## 2019-12-03 DIAGNOSIS — Z5112 Encounter for antineoplastic immunotherapy: Secondary | ICD-10-CM

## 2019-12-03 DIAGNOSIS — C3431 Malignant neoplasm of lower lobe, right bronchus or lung: Secondary | ICD-10-CM | POA: Diagnosis not present

## 2019-12-03 DIAGNOSIS — Z5111 Encounter for antineoplastic chemotherapy: Secondary | ICD-10-CM | POA: Diagnosis not present

## 2019-12-03 DIAGNOSIS — C3490 Malignant neoplasm of unspecified part of unspecified bronchus or lung: Secondary | ICD-10-CM

## 2019-12-03 DIAGNOSIS — C3411 Malignant neoplasm of upper lobe, right bronchus or lung: Secondary | ICD-10-CM | POA: Diagnosis not present

## 2019-12-03 LAB — CBC WITH DIFFERENTIAL (CANCER CENTER ONLY)
Abs Immature Granulocytes: 0.03 10*3/uL (ref 0.00–0.07)
Basophils Absolute: 0.1 10*3/uL (ref 0.0–0.1)
Basophils Relative: 1 %
Eosinophils Absolute: 0.1 10*3/uL (ref 0.0–0.5)
Eosinophils Relative: 1 %
HCT: 43.8 % (ref 39.0–52.0)
Hemoglobin: 14.6 g/dL (ref 13.0–17.0)
Immature Granulocytes: 1 %
Lymphocytes Relative: 28 %
Lymphs Abs: 1.4 10*3/uL (ref 0.7–4.0)
MCH: 32.7 pg (ref 26.0–34.0)
MCHC: 33.3 g/dL (ref 30.0–36.0)
MCV: 98 fL (ref 80.0–100.0)
Monocytes Absolute: 1.4 10*3/uL — ABNORMAL HIGH (ref 0.1–1.0)
Monocytes Relative: 29 %
Neutro Abs: 2 10*3/uL (ref 1.7–7.7)
Neutrophils Relative %: 40 %
Platelet Count: 351 10*3/uL (ref 150–400)
RBC: 4.47 MIL/uL (ref 4.22–5.81)
RDW: 18.7 % — ABNORMAL HIGH (ref 11.5–15.5)
WBC Count: 4.9 10*3/uL (ref 4.0–10.5)
nRBC: 0 % (ref 0.0–0.2)

## 2019-12-03 LAB — CMP (CANCER CENTER ONLY)
ALT: 101 U/L — ABNORMAL HIGH (ref 0–44)
AST: 91 U/L — ABNORMAL HIGH (ref 15–41)
Albumin: 3.6 g/dL (ref 3.5–5.0)
Alkaline Phosphatase: 81 U/L (ref 38–126)
Anion gap: 10 (ref 5–15)
BUN: 6 mg/dL — ABNORMAL LOW (ref 8–23)
CO2: 29 mmol/L (ref 22–32)
Calcium: 9.2 mg/dL (ref 8.9–10.3)
Chloride: 99 mmol/L (ref 98–111)
Creatinine: 0.68 mg/dL (ref 0.61–1.24)
GFR, Estimated: >60 mL/min (ref >60)
Glucose, Bld: 116 mg/dL — ABNORMAL HIGH (ref 70–99)
Potassium: 4.2 mmol/L (ref 3.5–5.1)
Sodium: 138 mmol/L (ref 135–145)
Total Bilirubin: 0.6 mg/dL (ref 0.3–1.2)
Total Protein: 7.6 g/dL (ref 6.5–8.1)

## 2019-12-03 LAB — TSH: TSH: 2.148 u[IU]/mL (ref 0.320–4.118)
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
## 2019-12-04 ENCOUNTER — Telehealth: Payer: Self-pay | Admitting: Internal Medicine

## 2019-12-04 NOTE — Telephone Encounter (Signed)
Scheduled per los. Called and left msg. Mailed printout  °

## 2019-12-10 ENCOUNTER — Other Ambulatory Visit: Payer: No Typology Code available for payment source

## 2019-12-17 ENCOUNTER — Other Ambulatory Visit: Payer: No Typology Code available for payment source

## 2019-12-19 ENCOUNTER — Telehealth: Payer: Self-pay | Admitting: Internal Medicine

## 2019-12-19 NOTE — Telephone Encounter (Signed)
Scheduled appt per 11/24 sch msg - pt is aware of new appt date and time

## 2019-12-21 ENCOUNTER — Inpatient Hospital Stay: Payer: No Typology Code available for payment source

## 2019-12-24 ENCOUNTER — Ambulatory Visit: Payer: No Typology Code available for payment source

## 2019-12-24 ENCOUNTER — Other Ambulatory Visit: Payer: No Typology Code available for payment source

## 2019-12-24 ENCOUNTER — Ambulatory Visit: Payer: No Typology Code available for payment source | Admitting: Internal Medicine

## 2019-12-31 ENCOUNTER — Other Ambulatory Visit: Payer: Self-pay

## 2019-12-31 ENCOUNTER — Inpatient Hospital Stay: Payer: No Typology Code available for payment source | Attending: Internal Medicine

## 2019-12-31 ENCOUNTER — Ambulatory Visit (HOSPITAL_COMMUNITY): Payer: No Typology Code available for payment source

## 2019-12-31 ENCOUNTER — Other Ambulatory Visit: Payer: Self-pay | Admitting: *Deleted

## 2019-12-31 DIAGNOSIS — C3490 Malignant neoplasm of unspecified part of unspecified bronchus or lung: Secondary | ICD-10-CM

## 2019-12-31 DIAGNOSIS — Z902 Acquired absence of lung [part of]: Secondary | ICD-10-CM | POA: Diagnosis not present

## 2019-12-31 DIAGNOSIS — C3431 Malignant neoplasm of lower lobe, right bronchus or lung: Secondary | ICD-10-CM

## 2019-12-31 DIAGNOSIS — C7951 Secondary malignant neoplasm of bone: Secondary | ICD-10-CM | POA: Diagnosis present

## 2019-12-31 DIAGNOSIS — Z96642 Presence of left artificial hip joint: Secondary | ICD-10-CM | POA: Diagnosis not present

## 2019-12-31 DIAGNOSIS — C3432 Malignant neoplasm of lower lobe, left bronchus or lung: Secondary | ICD-10-CM | POA: Insufficient documentation

## 2019-12-31 LAB — CBC WITH DIFFERENTIAL (CANCER CENTER ONLY)
Abs Immature Granulocytes: 0.02 10*3/uL (ref 0.00–0.07)
Basophils Absolute: 0.1 10*3/uL (ref 0.0–0.1)
Basophils Relative: 1 %
Eosinophils Absolute: 0.1 10*3/uL (ref 0.0–0.5)
Eosinophils Relative: 1 %
HCT: 48.5 % (ref 39.0–52.0)
Hemoglobin: 16.2 g/dL (ref 13.0–17.0)
Immature Granulocytes: 0 %
Lymphocytes Relative: 24 %
Lymphs Abs: 1.5 10*3/uL (ref 0.7–4.0)
MCH: 34.8 pg — ABNORMAL HIGH (ref 26.0–34.0)
MCHC: 33.4 g/dL (ref 30.0–36.0)
MCV: 104.1 fL — ABNORMAL HIGH (ref 80.0–100.0)
Monocytes Absolute: 1 10*3/uL (ref 0.1–1.0)
Monocytes Relative: 17 %
Neutro Abs: 3.4 10*3/uL (ref 1.7–7.7)
Neutrophils Relative %: 57 %
Platelet Count: 199 10*3/uL (ref 150–400)
RBC: 4.66 MIL/uL (ref 4.22–5.81)
RDW: 18.6 % — ABNORMAL HIGH (ref 11.5–15.5)
WBC Count: 6.1 10*3/uL (ref 4.0–10.5)
nRBC: 0 % (ref 0.0–0.2)

## 2019-12-31 LAB — CMP (CANCER CENTER ONLY)
ALT: 163 U/L — ABNORMAL HIGH (ref 0–44)
AST: 154 U/L — ABNORMAL HIGH (ref 15–41)
Albumin: 3.7 g/dL (ref 3.5–5.0)
Alkaline Phosphatase: 91 U/L (ref 38–126)
Anion gap: 9 (ref 5–15)
BUN: 6 mg/dL — ABNORMAL LOW (ref 8–23)
CO2: 28 mmol/L (ref 22–32)
Calcium: 9.9 mg/dL (ref 8.9–10.3)
Chloride: 101 mmol/L (ref 98–111)
Creatinine: 0.7 mg/dL (ref 0.61–1.24)
GFR, Estimated: >60 mL/min (ref >60)
Glucose, Bld: 107 mg/dL — ABNORMAL HIGH (ref 70–99)
Potassium: 4.4 mmol/L (ref 3.5–5.1)
Sodium: 138 mmol/L (ref 135–145)
Total Bilirubin: 1 mg/dL (ref 0.3–1.2)
Total Protein: 8.4 g/dL — ABNORMAL HIGH (ref 6.5–8.1)

## 2019-12-31 LAB — TSH: TSH: 1.576 u[IU]/mL (ref 0.320–4.118)

## 2020-01-02 ENCOUNTER — Ambulatory Visit: Payer: No Typology Code available for payment source | Admitting: Internal Medicine

## 2020-01-04 ENCOUNTER — Telehealth: Payer: Self-pay

## 2020-01-04 NOTE — Telephone Encounter (Signed)
-----   Message from [facility] sent at 01/04/2020 10:22 AM EST ----- Regarding: FW: 12/14 - waiting scan Morning ,  Just an FYI -  this pt has been scheduled for 12/14 for f/u but looks like CT scan has not been scheduled. Looking at the orders , [facility] has tried to contact pt but has not been called back to schedule appt for CT scan  ----- Message ----- From: Nicholaus Corolla Sent: 01/01/2020   3:14 PM EST To: Nicholaus Corolla Subject: 12/14 - waiting scan                           Scheduling Message Entered by Ardeen Garland on 01/01/2020 at 1:55 PM Priority: Routine EST PT 15 Department: [facility] Provider: Curt Bears, MD Appointment Notes: Ct scan expected this week  Scheduling Notes: 12/14 f/u up with [facility]

## 2020-01-04 NOTE — Telephone Encounter (Signed)
I called the pt and LM advising he has appt with Dr. Julien Nordmann 01/08/20 but has not scheduled his CT Scan. I provided the contact number to Radiology scheduling.  I have also called the pts sister and spoke with her advising the same thing. She said she was at work and had nothing to write the number down with but she will let the pt know to check his voicemail and call to schedule his CT scan.

## 2020-01-08 ENCOUNTER — Inpatient Hospital Stay: Payer: No Typology Code available for payment source | Admitting: Internal Medicine

## 2020-01-17 ENCOUNTER — Ambulatory Visit (HOSPITAL_COMMUNITY)
Admission: RE | Admit: 2020-01-17 | Discharge: 2020-01-17 | Disposition: A | Discharge status: Home / Self Care | Payer: No Typology Code available for payment source | Source: Ambulatory Visit | Attending: Internal Medicine | Admitting: Internal Medicine

## 2020-01-17 ENCOUNTER — Other Ambulatory Visit: Payer: Self-pay

## 2020-01-17 ENCOUNTER — Encounter (HOSPITAL_COMMUNITY): Payer: Self-pay

## 2020-01-17 DIAGNOSIS — C3490 Malignant neoplasm of unspecified part of unspecified bronchus or lung: Secondary | ICD-10-CM | POA: Insufficient documentation

## 2020-01-17 MED ORDER — IOHEXOL 300 MG/ML  SOLN
100.0000 mL | Freq: Once | INTRAMUSCULAR | Status: AC | PRN
  Administered 2020-01-17: 09:00:00 100 mL via INTRAVENOUS

## 2020-01-21 ENCOUNTER — Telehealth: Payer: Self-pay | Admitting: Internal Medicine

## 2020-01-21 ENCOUNTER — Inpatient Hospital Stay (HOSPITAL_BASED_OUTPATIENT_CLINIC_OR_DEPARTMENT_OTHER): Payer: No Typology Code available for payment source | Admitting: Internal Medicine

## 2020-01-21 ENCOUNTER — Other Ambulatory Visit: Payer: Self-pay

## 2020-01-21 VITALS — BP 126/85 | HR 65 | Temp 98.8°F | Resp 18 | Ht 72.0 in | Wt 215.9 lb

## 2020-01-21 DIAGNOSIS — C3431 Malignant neoplasm of lower lobe, right bronchus or lung: Secondary | ICD-10-CM | POA: Diagnosis not present

## 2020-01-21 DIAGNOSIS — C3490 Malignant neoplasm of unspecified part of unspecified bronchus or lung: Secondary | ICD-10-CM

## 2020-01-21 DIAGNOSIS — C3432 Malignant neoplasm of lower lobe, left bronchus or lung: Secondary | ICD-10-CM | POA: Diagnosis not present

## 2020-01-21 NOTE — Patient Instructions (Signed)
Follow up 3 months with CT scan and visit with Dr. Julien Nordmann
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
## 2020-01-21 NOTE — Telephone Encounter (Signed)
Scheduled appt per 12/27 los - mailed letter with appt date and time

## 2020-04-18 ENCOUNTER — Encounter (HOSPITAL_COMMUNITY): Payer: Self-pay

## 2020-04-18 ENCOUNTER — Ambulatory Visit (HOSPITAL_COMMUNITY)
Admission: RE | Admit: 2020-04-18 | Discharge: 2020-04-18 | Disposition: A | Discharge status: Home / Self Care | Payer: No Typology Code available for payment source | Source: Ambulatory Visit | Attending: Internal Medicine | Admitting: Internal Medicine

## 2020-04-18 ENCOUNTER — Inpatient Hospital Stay: Payer: No Typology Code available for payment source | Attending: Internal Medicine

## 2020-04-18 ENCOUNTER — Other Ambulatory Visit: Payer: Self-pay

## 2020-04-18 DIAGNOSIS — Z902 Acquired absence of lung [part of]: Secondary | ICD-10-CM | POA: Insufficient documentation

## 2020-04-18 DIAGNOSIS — C3432 Malignant neoplasm of lower lobe, left bronchus or lung: Secondary | ICD-10-CM | POA: Insufficient documentation

## 2020-04-18 DIAGNOSIS — C3490 Malignant neoplasm of unspecified part of unspecified bronchus or lung: Secondary | ICD-10-CM | POA: Diagnosis not present

## 2020-04-18 DIAGNOSIS — C7951 Secondary malignant neoplasm of bone: Secondary | ICD-10-CM | POA: Insufficient documentation

## 2020-04-18 DIAGNOSIS — Z923 Personal history of irradiation: Secondary | ICD-10-CM | POA: Insufficient documentation

## 2020-04-18 LAB — CBC WITH DIFFERENTIAL (CANCER CENTER ONLY)
Abs Immature Granulocytes: 0.02 10*3/uL (ref 0.00–0.07)
Basophils Absolute: 0.1 10*3/uL (ref 0.0–0.1)
Basophils Relative: 1 %
Eosinophils Absolute: 0.1 10*3/uL (ref 0.0–0.5)
Eosinophils Relative: 2 %
HCT: 52.2 % — ABNORMAL HIGH (ref 39.0–52.0)
Hemoglobin: 17.2 g/dL — ABNORMAL HIGH (ref 13.0–17.0)
Immature Granulocytes: 0 %
Lymphocytes Relative: 33 %
Lymphs Abs: 2.2 10*3/uL (ref 0.7–4.0)
MCH: 33.5 pg (ref 26.0–34.0)
MCHC: 33 g/dL (ref 30.0–36.0)
MCV: 101.6 fL — ABNORMAL HIGH (ref 80.0–100.0)
Monocytes Absolute: 1 10*3/uL (ref 0.1–1.0)
Monocytes Relative: 14 %
Neutro Abs: 3.3 10*3/uL (ref 1.7–7.7)
Neutrophils Relative %: 50 %
Platelet Count: 200 10*3/uL (ref 150–400)
RBC: 5.14 MIL/uL (ref 4.22–5.81)
RDW: 12.8 % (ref 11.5–15.5)
WBC Count: 6.7 10*3/uL (ref 4.0–10.5)
nRBC: 0 % (ref 0.0–0.2)

## 2020-04-18 LAB — CMP (CANCER CENTER ONLY)
ALT: 141 U/L — ABNORMAL HIGH (ref 0–44)
AST: 107 U/L — ABNORMAL HIGH (ref 15–41)
Albumin: 3.6 g/dL (ref 3.5–5.0)
Alkaline Phosphatase: 91 U/L (ref 38–126)
Anion gap: 11 (ref 5–15)
BUN: 9 mg/dL (ref 8–23)
CO2: 29 mmol/L (ref 22–32)
Calcium: 9.4 mg/dL (ref 8.9–10.3)
Chloride: 101 mmol/L (ref 98–111)
Creatinine: 0.77 mg/dL (ref 0.61–1.24)
GFR, Estimated: >60 mL/min (ref >60)
Glucose, Bld: 103 mg/dL — ABNORMAL HIGH (ref 70–99)
Potassium: 4.4 mmol/L (ref 3.5–5.1)
Sodium: 141 mmol/L (ref 135–145)
Total Bilirubin: 0.8 mg/dL (ref 0.3–1.2)
Total Protein: 8.1 g/dL (ref 6.5–8.1)

## 2020-04-18 MED ORDER — IOHEXOL 300 MG/ML  SOLN
75.0000 mL | Freq: Once | INTRAMUSCULAR | Status: AC | PRN
  Administered 2020-04-18: 75 mL via INTRAVENOUS

## 2020-04-21 ENCOUNTER — Other Ambulatory Visit: Payer: Self-pay

## 2020-04-21 ENCOUNTER — Inpatient Hospital Stay (HOSPITAL_BASED_OUTPATIENT_CLINIC_OR_DEPARTMENT_OTHER): Payer: No Typology Code available for payment source | Admitting: Internal Medicine

## 2020-04-21 VITALS — BP 120/62 | HR 109 | Temp 97.8°F | Resp 20 | Ht 72.0 in | Wt 219.3 lb

## 2020-04-21 DIAGNOSIS — C3431 Malignant neoplasm of lower lobe, right bronchus or lung: Secondary | ICD-10-CM

## 2020-04-21 DIAGNOSIS — Z923 Personal history of irradiation: Secondary | ICD-10-CM | POA: Diagnosis not present

## 2020-04-21 DIAGNOSIS — C3490 Malignant neoplasm of unspecified part of unspecified bronchus or lung: Secondary | ICD-10-CM | POA: Diagnosis not present

## 2020-04-21 DIAGNOSIS — C3432 Malignant neoplasm of lower lobe, left bronchus or lung: Secondary | ICD-10-CM | POA: Diagnosis present

## 2020-04-21 DIAGNOSIS — C7951 Secondary malignant neoplasm of bone: Secondary | ICD-10-CM | POA: Diagnosis not present

## 2020-04-21 DIAGNOSIS — Z902 Acquired absence of lung [part of]: Secondary | ICD-10-CM | POA: Diagnosis not present
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
## 2020-04-23 ENCOUNTER — Telehealth: Payer: Self-pay | Admitting: Internal Medicine

## 2020-04-23 NOTE — Telephone Encounter (Signed)
Scheduled per los. Called and left msg. Mailed printout  °

## 2020-08-19 ENCOUNTER — Inpatient Hospital Stay: Payer: Medicare Other | Attending: Internal Medicine

## 2020-08-19 ENCOUNTER — Encounter (HOSPITAL_COMMUNITY): Payer: Self-pay

## 2020-08-19 ENCOUNTER — Other Ambulatory Visit: Payer: Self-pay

## 2020-08-19 ENCOUNTER — Ambulatory Visit (HOSPITAL_COMMUNITY)
Admission: RE | Admit: 2020-08-19 | Discharge: 2020-08-19 | Disposition: A | Discharge status: Home / Self Care | Payer: No Typology Code available for payment source | Source: Ambulatory Visit | Attending: Internal Medicine | Admitting: Internal Medicine

## 2020-08-19 DIAGNOSIS — C3490 Malignant neoplasm of unspecified part of unspecified bronchus or lung: Secondary | ICD-10-CM

## 2020-08-19 DIAGNOSIS — C3431 Malignant neoplasm of lower lobe, right bronchus or lung: Secondary | ICD-10-CM | POA: Diagnosis not present

## 2020-08-19 LAB — CMP (CANCER CENTER ONLY)
ALT: 100 U/L — ABNORMAL HIGH (ref 0–44)
AST: 77 U/L — ABNORMAL HIGH (ref 15–41)
Albumin: 3.7 g/dL (ref 3.5–5.0)
Alkaline Phosphatase: 101 U/L (ref 38–126)
Anion gap: 7 (ref 5–15)
BUN: 8 mg/dL (ref 8–23)
CO2: 29 mmol/L (ref 22–32)
Calcium: 9.4 mg/dL (ref 8.9–10.3)
Chloride: 102 mmol/L (ref 98–111)
Creatinine: 0.71 mg/dL (ref 0.61–1.24)
GFR, Estimated: >60 mL/min (ref >60)
Glucose, Bld: 93 mg/dL (ref 70–99)
Potassium: 4.6 mmol/L (ref 3.5–5.1)
Sodium: 138 mmol/L (ref 135–145)
Total Bilirubin: 0.9 mg/dL (ref 0.3–1.2)
Total Protein: 8.3 g/dL — ABNORMAL HIGH (ref 6.5–8.1)

## 2020-08-19 LAB — CBC WITH DIFFERENTIAL (CANCER CENTER ONLY)
Abs Immature Granulocytes: 0.02 10*3/uL (ref 0.00–0.07)
Basophils Absolute: 0.1 10*3/uL (ref 0.0–0.1)
Basophils Relative: 1 %
Eosinophils Absolute: 0.1 10*3/uL (ref 0.0–0.5)
Eosinophils Relative: 1 %
HCT: 50 % (ref 39.0–52.0)
Hemoglobin: 16.6 g/dL (ref 13.0–17.0)
Immature Granulocytes: 0 %
Lymphocytes Relative: 29 %
Lymphs Abs: 1.8 10*3/uL (ref 0.7–4.0)
MCH: 33.4 pg (ref 26.0–34.0)
MCHC: 33.2 g/dL (ref 30.0–36.0)
MCV: 100.6 fL — ABNORMAL HIGH (ref 80.0–100.0)
Monocytes Absolute: 0.9 10*3/uL (ref 0.1–1.0)
Monocytes Relative: 15 %
Neutro Abs: 3.2 10*3/uL (ref 1.7–7.7)
Neutrophils Relative %: 54 %
Platelet Count: 204 10*3/uL (ref 150–400)
RBC: 4.97 MIL/uL (ref 4.22–5.81)
RDW: 14.3 % (ref 11.5–15.5)
WBC Count: 6.1 10*3/uL (ref 4.0–10.5)
nRBC: 0 % (ref 0.0–0.2)

## 2020-08-19 MED ORDER — IOHEXOL 350 MG/ML SOLN
100.0000 mL | Freq: Once | INTRAVENOUS | Status: AC | PRN
  Administered 2020-08-19: 60 mL via INTRAVENOUS

## 2020-08-19 MED ORDER — SODIUM CHLORIDE (PF) 0.9 % IJ SOLN
INTRAMUSCULAR | Status: AC
  Filled 2020-08-19: qty 50

## 2020-08-21 ENCOUNTER — Inpatient Hospital Stay: Payer: Medicare Other | Admitting: Internal Medicine

## 2020-10-26 DIAGNOSIS — I361 Nonrheumatic tricuspid (valve) insufficiency: Secondary | ICD-10-CM | POA: Diagnosis not present

## 2020-10-30 ENCOUNTER — Encounter: Payer: Self-pay | Admitting: Internal Medicine

## 2020-10-30 NOTE — Progress Notes (Signed)
Pt's sister(Dottie Rosana Hoes) [phone] called inquiring about scan results.  Staff message sent to Diane RN and provider.

## 2020-11-03 ENCOUNTER — Telehealth: Payer: Self-pay | Admitting: Medical Oncology

## 2020-11-03 NOTE — Telephone Encounter (Signed)
She is requesting his scan results - I LVM that she is not listed on his HIPPA form .  Message sent to [facility] to advise re next appt.

## 2020-11-25 DEATH — deceased

## 2022-01-23 IMAGING — CT CT BIOPSY
1 of 3 series · 11 of 32 positions shown, 17 images · non-contrast
Comparison: none

INDICATION: 64-year-old male with possible metastasis to the right
acromion/scapula.

[Series 3: i-spiral 5.0 b40f · axial · 0.52mm/px · z∈[+172,+316]mm · 11 of 51 slices shown, 17 images]
[im 5/51  soft-tissue]
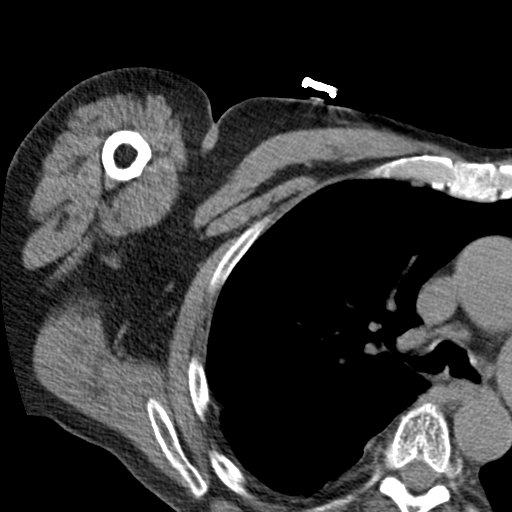
[im 5/51  bone]
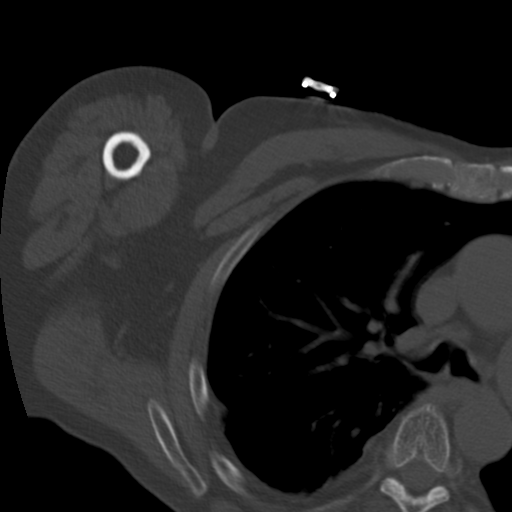
[im 9/51  soft-tissue]
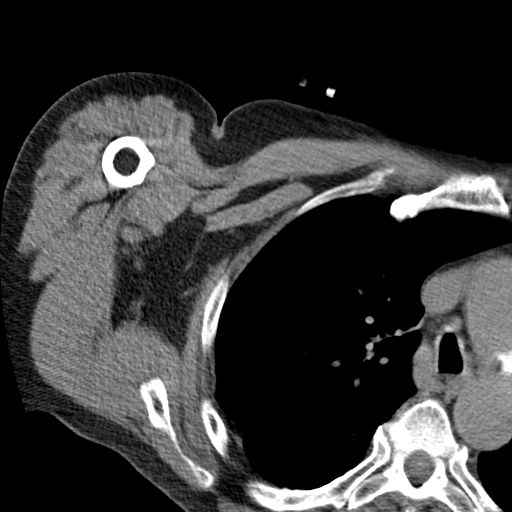
[im 13/51  soft-tissue]
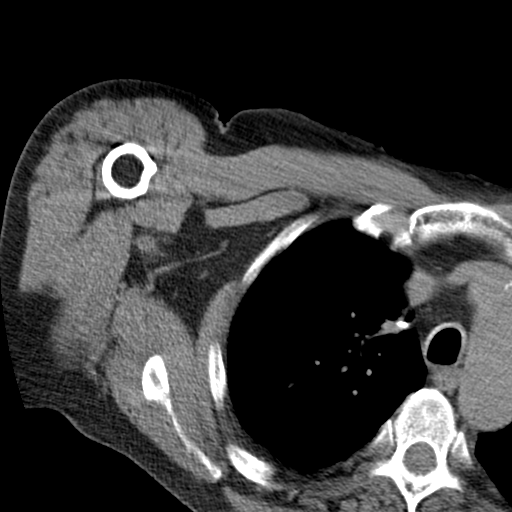
[im 17/51  soft-tissue]
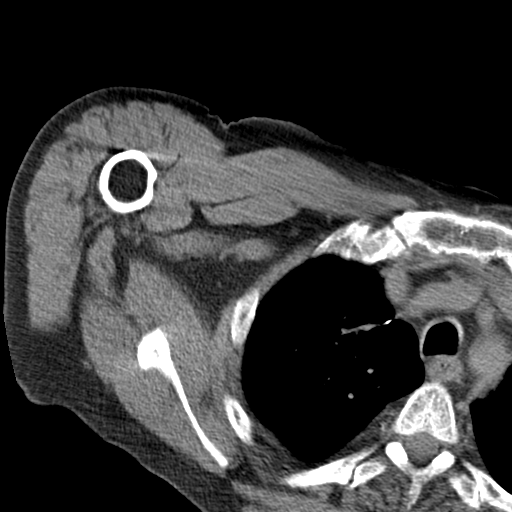
[im 21/51  soft-tissue]
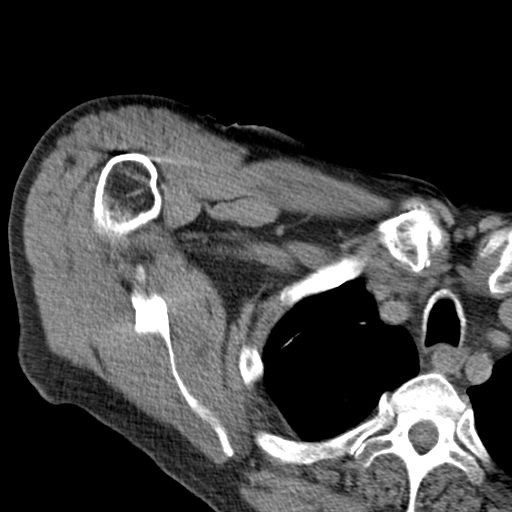
[im 26/51  soft-tissue]
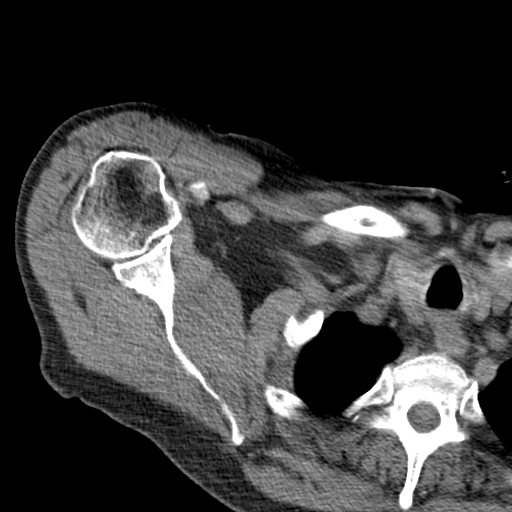
[im 30/51  soft-tissue]
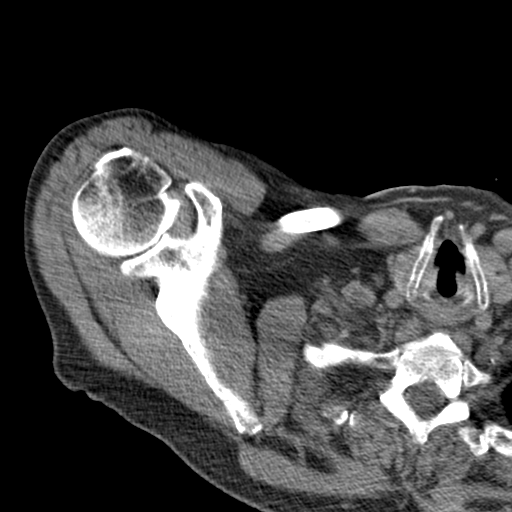
[im 34/51  soft-tissue]
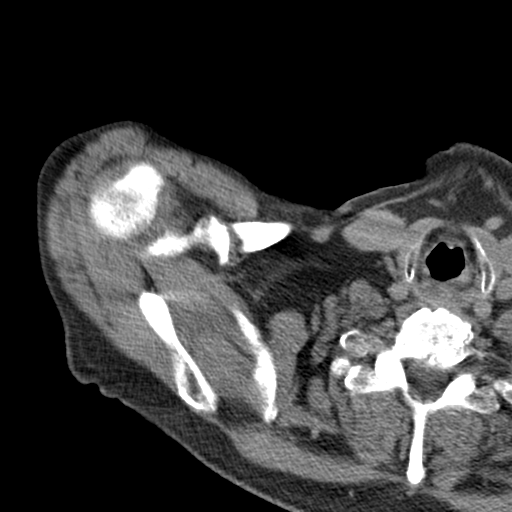
[im 34/51  lung]
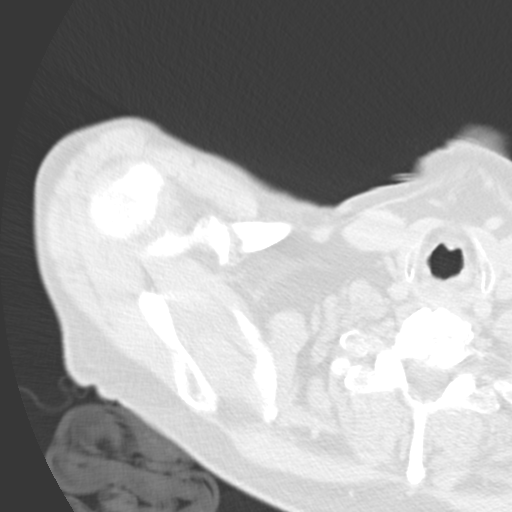
[im 38/51  soft-tissue]
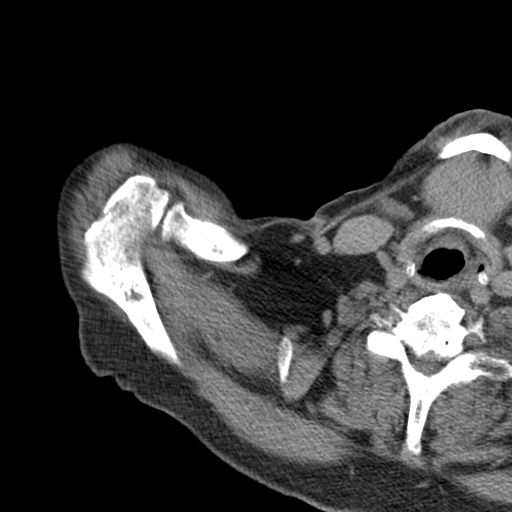
[im 38/51  lung]
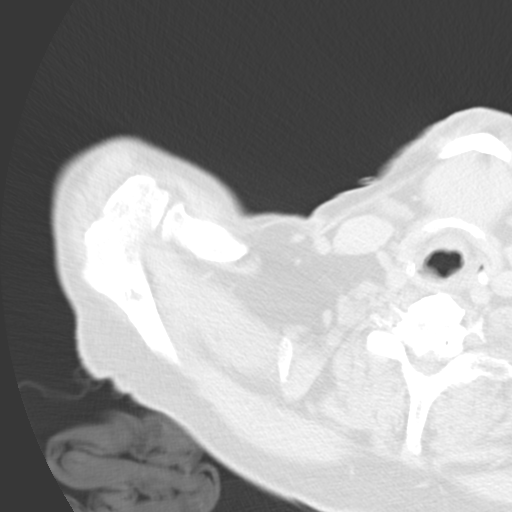
[im 38/51  bone]
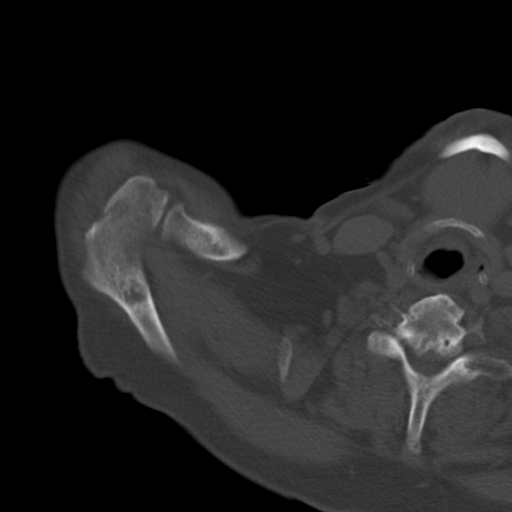
[im 42/51  soft-tissue]
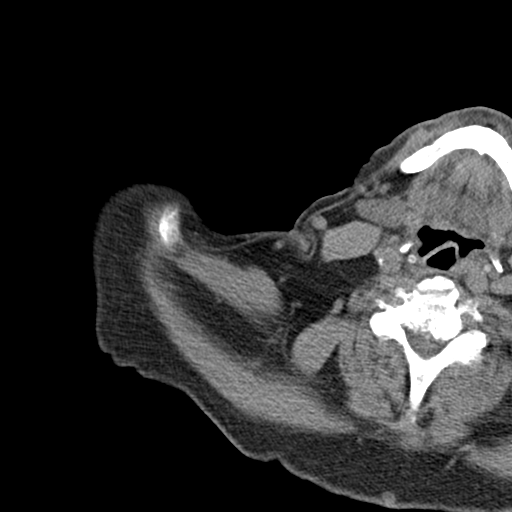
[im 42/51  lung]
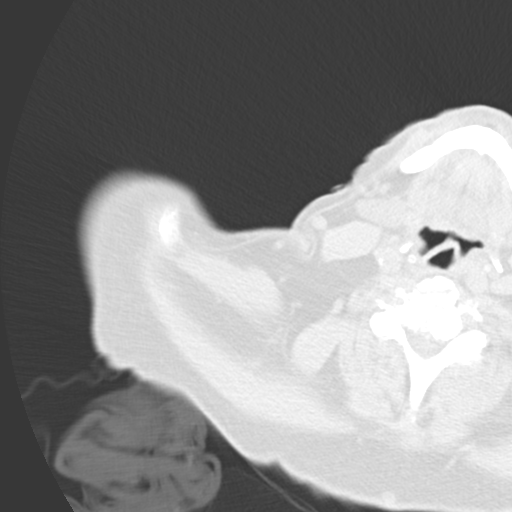
[im 46/51  soft-tissue]
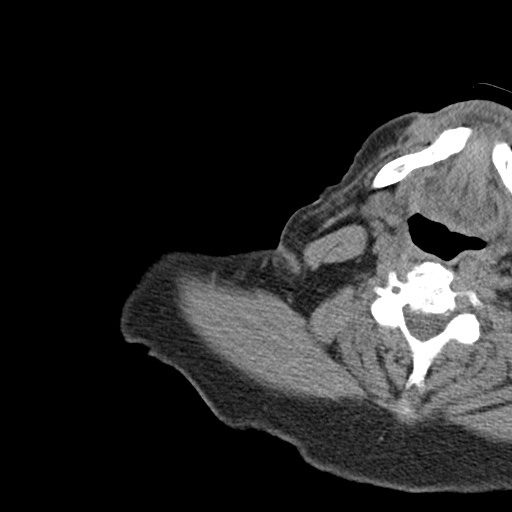
[im 46/51  lung]
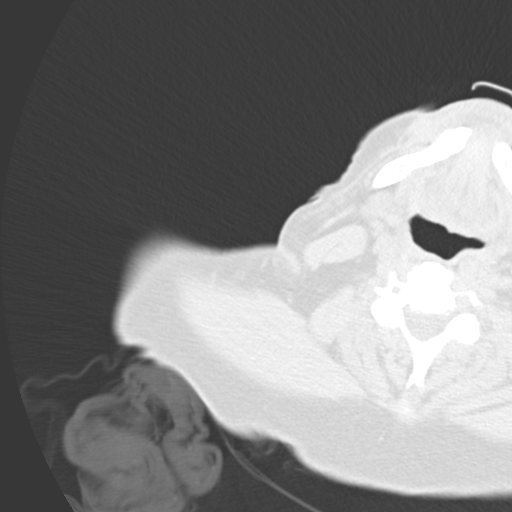

[11 of 32 positions shown; findings below may reference images not displayed]

EXAM:
CT BIOPSY

MEDICATIONS:
None.

ANESTHESIA/SEDATION:
Moderate (conscious) sedation was employed during this procedure. A
total of Versed 3.0 mg and Fentanyl 200 mcg was administered
intravenously.

Moderate Sedation Time: 25 minutes. The patient's level of
consciousness and vital signs were monitored continuously by
radiology nursing throughout the procedure under my direct
supervision.

FLUOROSCOPY TIME:  CT

COMPLICATIONS:
None

PROCEDURE:
Informed written consent was obtained from the patient after a
thorough discussion of the procedural risks, benefits and
alternatives. All questions were addressed. Maximal Sterile Barrier
Technique was utilized including caps, mask, sterile gowns, sterile
gloves, sterile drape, hand hygiene and skin antiseptic. A timeout
was performed prior to the initiation of the procedure.

Patient positioned supine position on the CT gantry table. Scout CT
of the right shoulder performed for planning purposes.

The patient is prepped and draped in the usual sterile fashion. 1%
lidocaine was used for local anesthesia.

Using CT guidance, Voigt needle was advanced to the lytic lesion in
the right scapula. Once we confirmed needle tip position, a coaxial
13 gauge core biopsy was performed and then a final 11 gauge core
biopsy. Specimen placed into formalin.

Sterile bandage was placed.

Patient tolerated the procedure well and remained hemodynamically
stable throughout.

No complications were encountered and no significant blood loss.
IMPRESSION: Status post CT-guided biopsy of right scapular lesion. Tissue
specimen sent to pathology for complete histopathologic analysis.

## 2022-12-15 NOTE — Telephone Encounter (Signed)
Telephone call
